# Patient Record
Sex: Male | Born: 1985 | Race: Black or African American | Hispanic: No | Marital: Single | Smoking: Current every day smoker
Health system: Southern US, Community
[De-identification: ages and names within clinical notes are randomized; demographics above are authoritative.]

## PROBLEM LIST (undated history)

## (undated) DIAGNOSIS — E669 Obesity, unspecified: Secondary | ICD-10-CM

---
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
## 2010-07-18 ENCOUNTER — Emergency Department (HOSPITAL_COMMUNITY): Payer: Commercial Managed Care - PPO

## 2010-07-18 ENCOUNTER — Emergency Department (HOSPITAL_COMMUNITY)
Admission: EM | Admit: 2010-07-18 | Discharge: 2010-07-18 | Disposition: A | Discharge status: Home / Self Care | Payer: Commercial Managed Care - PPO | Attending: Emergency Medicine | Admitting: Emergency Medicine

## 2010-07-18 DIAGNOSIS — X58XXXA Exposure to other specified factors, initial encounter: Secondary | ICD-10-CM | POA: Insufficient documentation

## 2010-07-18 DIAGNOSIS — R946 Abnormal results of thyroid function studies: Secondary | ICD-10-CM | POA: Insufficient documentation

## 2010-07-18 DIAGNOSIS — IMO0002 Reserved for concepts with insufficient information to code with codable children: Secondary | ICD-10-CM | POA: Insufficient documentation

## 2010-07-18 DIAGNOSIS — M25476 Effusion, unspecified foot: Secondary | ICD-10-CM | POA: Insufficient documentation

## 2010-07-18 DIAGNOSIS — M25473 Effusion, unspecified ankle: Secondary | ICD-10-CM | POA: Insufficient documentation

## 2011-12-03 ENCOUNTER — Emergency Department (HOSPITAL_COMMUNITY)
Admission: EM | Admit: 2011-12-03 | Discharge: 2011-12-03 | Disposition: A | Discharge status: Home / Self Care | Payer: Self-pay | Attending: Emergency Medicine | Admitting: Emergency Medicine

## 2011-12-03 ENCOUNTER — Emergency Department (HOSPITAL_COMMUNITY): Payer: Self-pay

## 2011-12-03 ENCOUNTER — Encounter (HOSPITAL_COMMUNITY): Payer: Self-pay | Admitting: Emergency Medicine

## 2011-12-03 DIAGNOSIS — Y9361 Activity, american tackle football: Secondary | ICD-10-CM | POA: Insufficient documentation

## 2011-12-03 DIAGNOSIS — X500XXA Overexertion from strenuous movement or load, initial encounter: Secondary | ICD-10-CM | POA: Insufficient documentation

## 2011-12-03 DIAGNOSIS — S93409A Sprain of unspecified ligament of unspecified ankle, initial encounter: Secondary | ICD-10-CM | POA: Insufficient documentation

## 2011-12-03 DIAGNOSIS — F17200 Nicotine dependence, unspecified, uncomplicated: Secondary | ICD-10-CM | POA: Insufficient documentation

## 2011-12-03 DIAGNOSIS — S93402A Sprain of unspecified ligament of left ankle, initial encounter: Secondary | ICD-10-CM

## 2011-12-03 DIAGNOSIS — Y9239 Other specified sports and athletic area as the place of occurrence of the external cause: Secondary | ICD-10-CM | POA: Insufficient documentation

## 2011-12-03 MED ORDER — NAPROXEN 250 MG PO TABS: 250.0000 mg | ORAL_TABLET | Freq: Two times a day (BID) | ORAL | Status: DC

## 2011-12-03 MED ORDER — HYDROCODONE-ACETAMINOPHEN 5-325 MG PO TABS
ORAL_TABLET | ORAL | Status: DC
Start: 2011-12-03 — End: 2012-05-22

## 2011-12-03 NOTE — ED Notes (Signed)
Pt c/o left ankle pain after twisting ankle playing football on Saturday with pain and swelling

## 2011-12-03 NOTE — Progress Notes (Signed)
Orthopedic Tech Progress Note Patient Details:  Philip Bautista 1985-11-23 [id number]  Ortho Devices Type of Ortho Device: Ankle Air splint;Crutches Ortho Device/Splint Location: LEFT AIR CAST AND CRUTCHES 6.3'' Ortho Device/Splint Interventions: Application   Cammer, Mickie Bail 12/03/2011, 5:59 PM

## 2011-12-03 NOTE — ED Notes (Signed)
Ortho called 

## 2011-12-03 NOTE — ED Provider Notes (Signed)
History  This chart was scribed for Laray Anger, DO by Kautzman Scrape, ED Scribe. This patient was seen in room TR06C/TR06C and the patient's care was started at 5:20 PM.  CSN: [id number]  Arrival date & time 12/03/11  1537   First MD Initiated Contact with Patient 12/03/11 1720      Chief Complaint  Patient presents with  . Ankle Pain    The history is provided by the patient. No language interpreter was used.   Pt was seen at 5:22 PM   Philip Bautista is a 26 y.o. male who presents to the Emergency Department complaining of gradual onset and persistence of constant left ankle "pain" that began 4 days ago.  Pt states the pain began after he "twisted it" while playing football.  Pain worsens with ambulation.  He also reports that he has sprained the same ankle severeal times but denies any prior fractures or having an orthopedist to follow up with. He denies any other injuries. Denies knee/foot pain, no focal motor weakness, no tingling/numbness in extremities, no open wounds.     History reviewed. No pertinent past medical history.  History reviewed. No pertinent past surgical history.   History  Substance Use Topics  . Smoking status: Current Every Day Smoker  . Smokeless tobacco: Not on file  . Alcohol Use: Yes      Review of Systems ROS: Statement: All systems negative except as marked or noted in the HPI; Constitutional: Negative for fever and chills. ; ; Eyes: Negative for eye pain, redness and discharge. ; ; ENMT: Negative for ear pain, hoarseness, nasal congestion, sinus pressure and sore throat. ; ; Cardiovascular: Negative for chest pain, palpitations, diaphoresis, dyspnea and peripheral edema. ; ; Respiratory: Negative for cough, wheezing and stridor. ; ; Gastrointestinal: Negative for nausea, vomiting, diarrhea, abdominal pain, blood in stool, hematemesis, jaundice and rectal bleeding.; ; Genitourinary: Negative for dysuria, flank pain and hematuria. ; ;  Musculoskeletal: Negative for back pain and neck pain. +left ankle pain.; ; Skin: Negative for pruritus, rash, abrasions, blisters, bruising and skin lesion.; ; Neuro: Negative for headache, lightheadedness and neck stiffness. Negative for weakness, altered level of consciousness , altered mental status, extremity weakness, paresthesias, involuntary movement, seizure and syncope.       Allergies  Review of patient's allergies indicates no known allergies.  Home Medications  No current outpatient prescriptions on file.  Triage Vitals: BP 147/81  Pulse 64  Temp 98.2 F (36.8 C) (Oral)  Resp 18  SpO2 100%  Physical Exam 1730: Physical examination:  Nursing notes reviewed; Vital signs and O2 SAT reviewed;  Constitutional: Well developed, Well nourished, Well hydrated, In no acute distress; Head:  Normocephalic, atraumatic; Eyes: EOMI, PERRL, No scleral icterus; ENMT: Mouth and pharynx normal, Mucous membranes moist; Neck: Supple, Full range of motion, No lymphadenopathy; Cardiovascular: Regular rate and rhythm, No murmur, rub, or gallop; Respiratory: Breath sounds clear & equal bilaterally, No rales, rhonchi, wheezes.  Speaking full sentences with ease, Normal respiratory effort/excursion; Chest: Nontender, Movement normal;; Extremities: Pulses normal, +tender to palp left lateral maleolar area w/localized edema, NMS intact left foot, strong pedal pp, LE muscle compartments soft.  No left proximal fibular head tenderness, no knee tenderness, no foot tenderness.  No deformity, no ecchymosis, no erythema, no open wounds.  +plantarflexion of left foot w/calf squeeze.  No palpable gap left Achilles's tendon.  Decreased ROM F/E d/t pain.  No calf edema or asymmetry.; Neuro: AA&Ox3, Major CN grossly intact.  Speech clear. No gross focal motor or sensory deficits in extremities.; Skin: Color normal, Warm, Dry.   ED Course  SPLINT APPLICATION Performed by: Laray Anger Authorized by: Laray Anger Consent: Verbal consent obtained. Risks and benefits: risks, benefits and alternatives were discussed Consent given by: patient Patient understanding: patient states understanding of the procedure being performed Imaging studies: imaging studies available Patient identity confirmed: verbally with patient and arm band Location details: left ankle Splint type: airsplint. Post-procedure: The splinted body part was neurovascularly unchanged following the procedure. Patient tolerance: Patient tolerated the procedure well with no immediate complications.     DIAGNOSTIC STUDIES: Oxygen Saturation is 100% on room air, normal by my interpretation.    COORDINATION OF CARE: 5:23 PM Discussed treatment plan which includes x-rays of the left ankle with pt at bedside and pt agreed to plan.      MDM  MDM Reviewed: nursing note and vitals Interpretation: x-ray   Dg Ankle Complete Left 12/03/2011  *RADIOLOGY REPORT*  Clinical Data: Twisted left ankle 4 days ago  LEFT ANKLE COMPLETE - 3+ VIEW  Comparison: None.  Findings: Three views of the left ankle submitted.  No acute fracture or subluxation.  Small plantar spur of the calcaneus. Ankle mortise is preserved.  IMPRESSION: No acute fracture or subluxation.  Small plantar spur of the calcaneus.   Original Report Authenticated By: Natasha Mead, M.D.      Vito.Ghee:  Will place airsplint and give crutches.  Dx and testing d/w pt.  Questions answered.  Verb understanding, agreeable to d/c home with outpt f/u.     I personally performed the services described in this documentation, which was scribed in my presence. The recorded information has been reviewed and is accurate.    Laray Anger, DO 12/05/11 1421

## 2012-05-22 ENCOUNTER — Encounter (HOSPITAL_COMMUNITY): Payer: Self-pay | Admitting: *Deleted

## 2012-05-22 ENCOUNTER — Emergency Department (HOSPITAL_COMMUNITY)
Admission: EM | Admit: 2012-05-22 | Discharge: 2012-05-22 | Disposition: A | Discharge status: Home / Self Care | Payer: Self-pay | Attending: Emergency Medicine | Admitting: Emergency Medicine

## 2012-05-22 DIAGNOSIS — J309 Allergic rhinitis, unspecified: Secondary | ICD-10-CM | POA: Insufficient documentation

## 2012-05-22 DIAGNOSIS — R059 Cough, unspecified: Secondary | ICD-10-CM | POA: Insufficient documentation

## 2012-05-22 DIAGNOSIS — F17200 Nicotine dependence, unspecified, uncomplicated: Secondary | ICD-10-CM | POA: Insufficient documentation

## 2012-05-22 DIAGNOSIS — R05 Cough: Secondary | ICD-10-CM | POA: Insufficient documentation

## 2012-05-22 MED ORDER — FLUTICASONE PROPIONATE 50 MCG/ACT NA SUSP: 2.0000 | Freq: Every day | NASAL | Status: DC

## 2012-05-22 NOTE — ED Notes (Signed)
Pt states that he has had nasal drainage and cough for 1 month. Pt states that OTC medications have not helped. Pt reports hx of allergies

## 2012-05-22 NOTE — ED Provider Notes (Signed)
Medical screening examination/treatment/procedure(s) were performed by non-physician practitioner and as supervising physician I was immediately available for consultation/collaboration.  Flint Melter, MD 05/22/12 1900

## 2012-05-22 NOTE — ED Provider Notes (Signed)
History     CSN: [id number]  Arrival date & time 05/22/12  1024   First MD Initiated Contact with Patient 05/22/12 1042      Chief Complaint  Patient presents with  . Allergies    (Consider location/radiation/quality/duration/timing/severity/associated sxs/prior treatment) HPI  Philip Bautista is a 27 y.o. male complaining of clear nasal drainage and positional cough worsening over the course of last month. Patient has been taking Zyrtec, Benadryl and Afrin with little relief. He denies fever, sputum production, chest pain, nausea vomiting, change in bowel or bladder habits.  History reviewed. No pertinent past medical history.  History reviewed. No pertinent past surgical history.  No family history on file.  History  Substance Use Topics  . Smoking status: Current Every Day Smoker  . Smokeless tobacco: Not on file  . Alcohol Use: Yes      Review of Systems  Constitutional: Negative for fever.  HENT: Positive for rhinorrhea.   Respiratory: Positive for cough. Negative for shortness of breath.   Cardiovascular: Negative for chest pain.  Gastrointestinal: Negative for nausea, vomiting, abdominal pain and diarrhea.  All other systems reviewed and are negative.    Allergies  Review of patient's allergies indicates no known allergies.  Home Medications   Current Outpatient Rx  Name  Route  Sig  Dispense  Refill  . cetirizine (ZYRTEC) 10 MG tablet   Oral   Take 10 mg by mouth daily as needed for allergies.         . diphenhydrAMINE (BENADRYL) 25 MG tablet   Oral   Take 25 mg by mouth at bedtime as needed for allergies.         Marland Kitchen oxymetazoline (AFRIN) 0.05 % nasal spray   Nasal   Place 2 sprays into the nose 3 (three) times daily.           BP 133/82  Pulse 64  Temp(Src) 97.3 F (36.3 C) (Oral)  Resp 16  Ht 6\' 3"  (1.905 m)  Wt 290 lb (131.543 kg)  BMI 36.25 kg/m2  SpO2 96%  Physical Exam  Nursing note and vitals reviewed. Constitutional:  He is oriented to person, place, and time. He appears well-developed and well-nourished. No distress.  HENT:  Head: Normocephalic.  Right Ear: External ear normal.  Left Ear: External ear normal.  Mouth/Throat: Oropharynx is clear and moist. No oropharyngeal exudate.  Eyes: Conjunctivae and EOM are normal.  Neck: Normal range of motion.  Cardiovascular: Normal rate, regular rhythm and intact distal pulses.   Pulmonary/Chest: Effort normal and breath sounds normal. No stridor. No respiratory distress. He has no wheezes. He has no rales. He exhibits no tenderness.  Abdominal: Soft. Bowel sounds are normal. He exhibits no distension and no mass. There is no tenderness. There is no rebound and no guarding.  Musculoskeletal: Normal range of motion.  Lymphadenopathy:    He has no cervical adenopathy.  Neurological: He is alert and oriented to person, place, and time.  Skin: Skin is warm.  Psychiatric: He has a normal mood and affect.    ED Course  Procedures (including critical care time)  Labs Reviewed - No data to display No results found.   1. Allergic rhinitis       MDM   Philip Bautista is a 27 y.o. male with allergic rhinitis, no signs of infection.   Filed Vitals:   05/22/12 1027 05/22/12 1035  BP: 133/82   Pulse: 64   Temp: 97.3 F (36.3 C)  TempSrc: Oral   Resp: 16   Height:  6\' 3"  (1.905 m)  Weight:  290 lb (131.543 kg)  SpO2: 96%      Pt verbalized understanding and agrees with care plan. Outpatient follow-up and return precautions given.    Discharge Medication List as of 05/22/2012 10:49 AM    START taking these medications   Details  fluticasone (FLONASE) 50 MCG/ACT nasal spray Place 2 sprays into the nose daily., Starting 05/22/2012, Until Discontinued, [employer], PA-C 05/22/12 1220

## 2012-10-07 ENCOUNTER — Emergency Department (HOSPITAL_COMMUNITY)
Admission: EM | Admit: 2012-10-07 | Discharge: 2012-10-07 | Disposition: A | Discharge status: Home / Self Care | Payer: Commercial Managed Care - PPO | Attending: Emergency Medicine | Admitting: Emergency Medicine

## 2012-10-07 ENCOUNTER — Encounter (HOSPITAL_COMMUNITY): Payer: Self-pay | Admitting: Emergency Medicine

## 2012-10-07 DIAGNOSIS — X500XXA Overexertion from strenuous movement or load, initial encounter: Secondary | ICD-10-CM | POA: Insufficient documentation

## 2012-10-07 DIAGNOSIS — Y9229 Other specified public building as the place of occurrence of the external cause: Secondary | ICD-10-CM | POA: Insufficient documentation

## 2012-10-07 DIAGNOSIS — F17200 Nicotine dependence, unspecified, uncomplicated: Secondary | ICD-10-CM | POA: Insufficient documentation

## 2012-10-07 DIAGNOSIS — Y93B3 Activity, free weights: Secondary | ICD-10-CM | POA: Insufficient documentation

## 2012-10-07 DIAGNOSIS — T148XXA Other injury of unspecified body region, initial encounter: Secondary | ICD-10-CM

## 2012-10-07 DIAGNOSIS — IMO0002 Reserved for concepts with insufficient information to code with codable children: Secondary | ICD-10-CM | POA: Insufficient documentation

## 2012-10-07 MED ORDER — IBUPROFEN 800 MG PO TABS: 800.0000 mg | ORAL_TABLET | Freq: Three times a day (TID) | ORAL | Status: DC

## 2012-10-07 NOTE — ED Notes (Signed)
Rt back of thigh pain started yesterday after he got off work tried to work out but this am it was worse hurts to walk does not remember any injury

## 2012-10-07 NOTE — ED Provider Notes (Signed)
CSN: [id number]     Arrival date & time 10/07/12  5621 History   First MD Initiated Contact with Patient 10/07/12 [phone]     Chief Complaint  Patient presents with  . Leg Pain   (Consider location/radiation/quality/duration/timing/severity/associated sxs/prior Treatment) HPI Comments: Patient presents to the emergency department with chief complaint of right leg pain. Patient states that yesterday after work, he went to the gym and was doing squats, when he believes that he strained his right hamstring. Patient states the pain is worsened with ambulation, but states that he is still able to appropriately. He has not tried anything to alleviate his symptoms. States the pain is moderate. He did not hear or feel a pop. The pain does not radiate.  The history is provided by the patient. No language interpreter was used.    History reviewed. No pertinent past medical history. History reviewed. No pertinent past surgical history. No family history on file. History  Substance Use Topics  . Smoking status: Current Every Day Smoker  . Smokeless tobacco: Not on file  . Alcohol Use: Yes    Review of Systems  All other systems reviewed and are negative.    Allergies  Review of patient's allergies indicates no known allergies.  Home Medications   Current Outpatient Rx  Name  Route  Sig  Dispense  Refill  . ibuprofen (ADVIL,MOTRIN) 800 MG tablet   Oral   Take 1 tablet (800 mg total) by mouth 3 (three) times daily.   21 tablet   0    Pulse 76  Temp(Src) 99 F (37.2 C)  Resp 16  SpO2 99% Physical Exam  Nursing note and vitals reviewed. Constitutional: He is oriented to person, place, and time. He appears well-developed and well-nourished.  HENT:  Head: Normocephalic and atraumatic.  Eyes: Conjunctivae and EOM are normal. Right eye exhibits no discharge. Left eye exhibits no discharge. No scleral icterus.  Neck: Normal range of motion. Neck supple. No JVD present.  Cardiovascular:  Normal rate, regular rhythm, normal heart sounds and intact distal pulses.  Exam reveals no gallop and no friction rub.   No murmur heard. Intact distal pulses with brisk capillary refill  Pulmonary/Chest: Effort normal and breath sounds normal. No respiratory distress. He has no wheezes. He has no rales. He exhibits no tenderness.  Abdominal: Soft. Bowel sounds are normal. He exhibits no distension and no mass. There is no tenderness. There is no rebound and no guarding.  Musculoskeletal: Normal range of motion. He exhibits tenderness. He exhibits no edema.  Right hamstring moderately tender to palpation, but no obvious masses or bulges, no evidence of bruising, range of motion is 5/5, strength 4/5 limited by pain, patient is able to ambulate appropriately  Neurological: He is alert and oriented to person, place, and time. He has normal reflexes.  Sensation and strength intact  Skin: Skin is warm and dry.  Psychiatric: He has a normal mood and affect. His behavior is normal. Judgment and thought content normal.    ED Course  Procedures (including critical care time) Labs Review Labs Reviewed - No data to display Imaging Review No results found.  MDM   1. Muscle strain    Patient with right hamstring muscle strain, no evidence of partial or complete tear, no bruising, no bulges, range of motion and strength are intact, but somewhat limited secondary to pain. Will have the patient followup with orthopedics/sports medicine. Recommend ice and NSAIDs. Patient understands and agrees with the plan. Return  precautions and restrictions were discussed. He is well appearing, not in any apparent distress. Discharged patient to home in good condition.    Roxy Horseman, PA-C 10/07/12 [phone]

## 2012-10-07 NOTE — ED Provider Notes (Signed)
Medical screening examination/treatment/procedure(s) were performed by non-physician practitioner and as supervising physician I was immediately available for consultation/collaboration.    Vida Roller, MD 10/07/12 [phone]

## 2014-04-15 ENCOUNTER — Emergency Department (HOSPITAL_COMMUNITY)
Admission: EM | Admit: 2014-04-15 | Discharge: 2014-04-15 | Disposition: A | Discharge status: Home / Self Care | Payer: Commercial Managed Care - PPO | Attending: Emergency Medicine | Admitting: Emergency Medicine

## 2014-04-15 ENCOUNTER — Encounter (HOSPITAL_COMMUNITY): Payer: Self-pay | Admitting: Physical Medicine and Rehabilitation

## 2014-04-15 DIAGNOSIS — Z720 Tobacco use: Secondary | ICD-10-CM | POA: Insufficient documentation

## 2014-04-15 DIAGNOSIS — Z791 Long term (current) use of non-steroidal anti-inflammatories (NSAID): Secondary | ICD-10-CM | POA: Insufficient documentation

## 2014-04-15 DIAGNOSIS — K2970 Gastritis, unspecified, without bleeding: Secondary | ICD-10-CM

## 2014-04-15 LAB — CBC WITH DIFFERENTIAL/PLATELET
BASOS ABS: 0 10*3/uL (ref 0.0–0.1)
BASOS PCT: 0 % (ref 0–1)
Eosinophils Absolute: 0.1 10*3/uL (ref 0.0–0.7)
Eosinophils Relative: 2 % (ref 0–5)
HEMATOCRIT: 46.4 % (ref 39.0–52.0)
HEMOGLOBIN: 15.7 g/dL (ref 13.0–17.0)
LYMPHS PCT: 29 % (ref 12–46)
Lymphs Abs: 2.6 10*3/uL (ref 0.7–4.0)
MCH: 27.9 pg (ref 26.0–34.0)
MCHC: 33.8 g/dL (ref 30.0–36.0)
MCV: 82.6 fL (ref 78.0–100.0)
Monocytes Absolute: 0.7 10*3/uL (ref 0.1–1.0)
Monocytes Relative: 7 % (ref 3–12)
NEUTROS ABS: 5.5 10*3/uL (ref 1.7–7.7)
Neutrophils Relative %: 62 % (ref 43–77)
Platelets: 300 10*3/uL (ref 150–400)
RBC: 5.62 MIL/uL (ref 4.22–5.81)
RDW: 14.4 % (ref 11.5–15.5)
WBC: 8.9 10*3/uL (ref 4.0–10.5)

## 2014-04-15 LAB — COMPREHENSIVE METABOLIC PANEL
ALK PHOS: 40 U/L (ref 39–117)
ALT: 20 U/L (ref 0–53)
ANION GAP: 10 (ref 5–15)
AST: 23 U/L (ref 0–37)
Albumin: 3.8 g/dL (ref 3.5–5.2)
BUN: 9 mg/dL (ref 6–23)
CALCIUM: 9.5 mg/dL (ref 8.4–10.5)
CO2: 27 mmol/L (ref 19–32)
Chloride: 103 mmol/L (ref 96–112)
Creatinine, Ser: 1.18 mg/dL (ref 0.50–1.35)
GFR calc non Af Amer: 83 mL/min — ABNORMAL LOW (ref >90)
GLUCOSE: 99 mg/dL (ref 70–99)
Potassium: 3.5 mmol/L (ref 3.5–5.1)
Sodium: 140 mmol/L (ref 135–145)
TOTAL PROTEIN: 5.8 g/dL — AB (ref 6.0–8.3)
Total Bilirubin: 0.7 mg/dL (ref 0.3–1.2)

## 2014-04-15 MED ORDER — ONDANSETRON HCL 4 MG PO TABS: 4.0000 mg | ORAL_TABLET | Freq: Four times a day (QID) | ORAL | Status: DC

## 2014-04-15 NOTE — Discharge Instructions (Signed)
Please rest, drink plenty fluids, and monitor for new or worsening signs or symptoms. Please return if any concerning signs or symptoms present. Use Zofran as needed for nausea

## 2014-04-15 NOTE — ED Provider Notes (Signed)
CSN: [id number]     Arrival date & time 04/15/14  1315 History   First MD Initiated Contact with Patient 04/15/14 1503     Chief Complaint  Patient presents with  . Nausea  . Emesis   HPI   29 year old male presents today with 24 hours of nausea and vomiting. Reports the episodic with 2 episodes last night while he was at work, and to this morning upon wakening. Reports the emesis is nonbloody, and feels "normal in between episodes". He denies headache, rhinorrhea, sore throat, cough, chest pain, shortness breath, abdominal pain, changes in urination, lower extremity swelling. He notes he hasn't had a bowel movement since late on Wednesday, reporting that he has had a reduced appetite since then. He reports the nausea is not associated with fluid intake, but has not eaten any food. Patient denies any abdominal surgeries, chronic health conditions. He takes no medications at this time. She reports he lives at home with his daughter, has not any abnormal foods, nobody in his household is sick. At time of evaluation patient has no concerns, no abdominal pain, no nausea.  History reviewed. No pertinent past medical history. History reviewed. No pertinent past surgical history. History reviewed. No pertinent family history. History  Substance Use Topics  . Smoking status: Current Every Day Smoker    Types: Cigarettes  . Smokeless tobacco: Not on file  . Alcohol Use: Yes    Review of Systems  All other systems reviewed and are negative.   Allergies  Review of patient's allergies indicates no known allergies.  Home Medications   Prior to Admission medications   Medication Sig Start Date End Date Taking? Authorizing Provider  ibuprofen (ADVIL,MOTRIN) 800 MG tablet Take 1 tablet (800 mg total) by mouth 3 (three) times daily. 10/07/12   Roxy Horsemanobert Browning, PA-C   BP 118/74 mmHg  Pulse 60  Temp(Src) 98.7 F (37.1 C) (Oral)  Resp 18  Ht 6\' 3"  (1.905 m)  Wt 280 lb (127.007 kg)  BMI 35.00  kg/m2  SpO2 100% Physical Exam  Constitutional: He is oriented to person, place, and time. He appears well-developed and well-nourished.  HENT:  Head: Normocephalic and atraumatic.  Eyes: Pupils are equal, round, and reactive to light.  Neck: Normal range of motion. Neck supple. No JVD present. No tracheal deviation present. No thyromegaly present.  Cardiovascular: Normal rate, regular rhythm, normal heart sounds and intact distal pulses.  Exam reveals no gallop and no friction rub.   No murmur heard. Pulmonary/Chest: Effort normal and breath sounds normal. No stridor. No respiratory distress. He has no wheezes. He has no rales. He exhibits no tenderness.  Abdominal: Soft. Bowel sounds are normal. He exhibits no distension and no mass. There is no tenderness. There is no rebound and no guarding.  Musculoskeletal: Normal range of motion.  Lymphadenopathy:    He has no cervical adenopathy.  Neurological: He is alert and oriented to person, place, and time. Coordination normal.  Skin: Skin is warm and dry.  Psychiatric: He has a normal mood and affect. His behavior is normal. Judgment and thought content normal.  Nursing note and vitals reviewed.   ED Course  Procedures (including critical care time) Labs Review Labs Reviewed  COMPREHENSIVE METABOLIC PANEL - Abnormal; Notable for the following:    Total Protein 5.8 (*)    GFR calc non Af Amer 83 (*)    All other components within normal limits  CBC WITH DIFFERENTIAL/PLATELET    Imaging Review No results  found.   EKG Interpretation None     MDM   Final diagnoses:  Gastritis    At time of evaluation patient have any complaints. He reports the last bout of emesis with this morning and is not associated with abdominal pain, chest pain, diaphoresis, shortness of breath or any other signs or symptoms. Patient's vitals were stable and indicated that he was not planning to complete it. He was given fluid challenge test with no  difficulty. He was prescribed Zofran as needed for nausea instructed follow-up with his primary care provider or the emergency room if new or worsening symptoms presented. Patient understood and agreed to the plan.      Eyvonne Mechanic, PA-C 04/15/14 1806  Glynn Octave, MD 04/15/14 [phone]

## 2014-04-15 NOTE — ED Notes (Signed)
Pt presents to department for evaluation of nausea and vomiting. Onset yesterday. Denies abdominal pain. No signs of distress noted.

## 2015-11-06 ENCOUNTER — Ambulatory Visit (HOSPITAL_COMMUNITY)
Admission: EM | Admit: 2015-11-06 | Discharge: 2015-11-06 | Discharge status: Against Medical Advice | Payer: Commercial Managed Care - PPO

## 2015-11-06 NOTE — ED Notes (Signed)
No answer in lobby.

## 2015-11-07 ENCOUNTER — Emergency Department (HOSPITAL_COMMUNITY)
Admission: EM | Admit: 2015-11-07 | Discharge: 2015-11-07 | Disposition: A | Discharge status: Home / Self Care | Payer: Self-pay | Attending: Emergency Medicine | Admitting: Emergency Medicine

## 2015-11-07 ENCOUNTER — Encounter (HOSPITAL_COMMUNITY): Payer: Self-pay | Admitting: Emergency Medicine

## 2015-11-07 ENCOUNTER — Emergency Department (HOSPITAL_COMMUNITY): Payer: Self-pay

## 2015-11-07 DIAGNOSIS — K2100 Gastro-esophageal reflux disease with esophagitis, without bleeding: Secondary | ICD-10-CM

## 2015-11-07 DIAGNOSIS — R0789 Other chest pain: Secondary | ICD-10-CM | POA: Insufficient documentation

## 2015-11-07 DIAGNOSIS — F17210 Nicotine dependence, cigarettes, uncomplicated: Secondary | ICD-10-CM | POA: Insufficient documentation

## 2015-11-07 DIAGNOSIS — K219 Gastro-esophageal reflux disease without esophagitis: Secondary | ICD-10-CM | POA: Insufficient documentation

## 2015-11-07 LAB — CBC
HEMATOCRIT: 44.4 % (ref 39.0–52.0)
HEMOGLOBIN: 14.8 g/dL (ref 13.0–17.0)
MCH: 27.2 pg (ref 26.0–34.0)
MCHC: 33.3 g/dL (ref 30.0–36.0)
MCV: 81.6 fL (ref 78.0–100.0)
Platelets: 297 10*3/uL (ref 150–400)
RBC: 5.44 MIL/uL (ref 4.22–5.81)
RDW: 14.5 % (ref 11.5–15.5)
WBC: 11.7 10*3/uL — ABNORMAL HIGH (ref 4.0–10.5)

## 2015-11-07 LAB — I-STAT TROPONIN, ED
Troponin i, poc: 0 ng/mL (ref 0.00–0.08)
Troponin i, poc: 0.01 ng/mL (ref 0.00–0.08)

## 2015-11-07 LAB — BASIC METABOLIC PANEL
ANION GAP: 10 (ref 5–15)
BUN: 8 mg/dL (ref 6–20)
CALCIUM: 9.1 mg/dL (ref 8.9–10.3)
CHLORIDE: 103 mmol/L (ref 101–111)
CO2: 24 mmol/L (ref 22–32)
Creatinine, Ser: 1.11 mg/dL (ref 0.61–1.24)
GFR calc non Af Amer: >60 mL/min (ref >60)
GLUCOSE: 105 mg/dL — AB (ref 65–99)
Potassium: 3.9 mmol/L (ref 3.5–5.1)
Sodium: 137 mmol/L (ref 135–145)

## 2015-11-07 MED ORDER — ACETAMINOPHEN 325 MG PO TABS
650.0000 mg | ORAL_TABLET | Freq: Once | ORAL | Status: AC
  Administered 2015-11-07: 650 mg via ORAL
  Filled 2015-11-07: qty 2

## 2015-11-07 MED ORDER — GI COCKTAIL ~~LOC~~
30.0000 mL | Freq: Once | ORAL | Status: AC
  Administered 2015-11-07: 30 mL via ORAL
  Filled 2015-11-07: qty 30

## 2015-11-07 MED ORDER — PANTOPRAZOLE SODIUM 40 MG PO TBEC: 40.0000 mg | DELAYED_RELEASE_TABLET | Freq: Every day | ORAL | 0 refills | Status: DC

## 2015-11-07 MED ORDER — FAMOTIDINE 20 MG PO TABS
20.0000 mg | ORAL_TABLET | Freq: Once | ORAL | Status: AC
  Administered 2015-11-07: 20 mg via ORAL
  Filled 2015-11-07: qty 1

## 2015-11-07 NOTE — ED Notes (Signed)
States cp started on friiday, went to [facility] yesterday but they had long wait and he left, states vomited yesterday at work and dizziness started yesterday

## 2015-11-07 NOTE — Discharge Instructions (Signed)
It was our pleasure to provide your ER care today - we hope that you feel better.  Take protonix (acid blocker medication).  You may also try maalox or mylanta as need for symptom relief.  Take tylenol as need.  Follow up with primary care doctor in the next few days.   Follow chest discomfort, and ecg, follow up with cardiologist in the next week - see referral - call office to arrange appointment.  Return to ER if worse, recurrent or persistent chest pain, trouble breathing, other concern.

## 2015-11-07 NOTE — ED Notes (Signed)
Pt states pain is a 4 now, was sleeping before

## 2015-11-07 NOTE — ED Provider Notes (Signed)
[facility]-EMERGENCY DEPT Provider Note   CSN: [id number] Arrival date & time: 11/07/15  0702     History   Chief Complaint Chief Complaint  Patient presents with  . Dizziness  . Chest Pain    HPI Zachery DakinsCharles A Loeffler is a 30 y.o. male.  Patient c/o 'heartburn' for the past 3 days. He indicates symptoms have been constant, persistent, worse w eating, but cant identify specific food type that makes it worse. No radiation of pain. No associated nv, diaphoresis or sob. Denies any exertional cp or discomfort. No unusual doe. No cough or uri c/o. No fever or chills. No chest wall injury or strain. No hx gerd.  Denies fam hx cad. No cocaine use. +smoker.     The history is provided by the patient.  Dizziness  Associated symptoms: chest pain   Associated symptoms: no headaches, no palpitations, no shortness of breath and no vomiting   Chest Pain   Associated symptoms include dizziness. Pertinent negatives include no abdominal pain, no back pain, no fever, no headaches, no palpitations, no shortness of breath and no vomiting.    History reviewed. No pertinent past medical history.  There are no active problems to display for this patient.   History reviewed. No pertinent surgical history.     Home Medications    Prior to Admission medications   Medication Sig Start Date End Date Taking? Authorizing Provider  ibuprofen (ADVIL,MOTRIN) 800 MG tablet Take 1 tablet (800 mg total) by mouth 3 (three) times daily. Patient not taking: Reported on 04/15/2014 10/07/12   Roxy Horsemanobert Browning, PA-C  ondansetron (ZOFRAN) 4 MG tablet Take 1 tablet (4 mg total) by mouth every 6 (six) hours. 04/15/14   Eyvonne MechanicJeffrey Hedges, PA-C    Family History No family history on file.  Social History Social History  Substance Use Topics  . Smoking status: Current Every Day Smoker    Packs/day: 1.00    Types: Cigarettes  . Smokeless tobacco: Not on file  . Alcohol use No     Allergies   Lentil   Review of  Systems Review of Systems  Constitutional: Negative for fever.  HENT: Negative for sore throat.   Eyes: Negative for redness.  Respiratory: Negative for shortness of breath.   Cardiovascular: Positive for chest pain. Negative for palpitations and leg swelling.  Gastrointestinal: Negative for abdominal pain and vomiting.  Genitourinary: Negative for flank pain.  Musculoskeletal: Negative for back pain and neck pain.  Skin: Negative for rash.  Neurological: Positive for dizziness. Negative for headaches.  Hematological: Does not bruise/bleed easily.  Psychiatric/Behavioral: Negative for confusion.     Physical Exam Updated Vital Signs BP 127/89 (BP Location: Right Arm)   Pulse 69   Temp 98.4 F (36.9 C) (Oral)   Resp 16   Ht 6\' 3"  (1.905 m)   Wt 134.7 kg   SpO2 96%   BMI 37.12 kg/m   Physical Exam  Constitutional: He appears well-developed and well-nourished. No distress.  HENT:  Head: Atraumatic.  Mouth/Throat: Oropharynx is clear and moist.  Eyes: Conjunctivae are normal.  Neck: Neck supple. No tracheal deviation present.  Cardiovascular: Normal rate, regular rhythm, normal heart sounds and intact distal pulses.  Exam reveals no gallop and no friction rub.   No murmur heard. Pulmonary/Chest: Effort normal and breath sounds normal. No accessory muscle usage. No respiratory distress. He has no wheezes. He has no rales. He exhibits no tenderness.  Abdominal: Soft. Bowel sounds are normal. He exhibits no distension.  There is no tenderness.  Musculoskeletal: He exhibits no edema or tenderness.  Neurological: He is alert.  Skin: Skin is warm and dry. No rash noted. He is not diaphoretic.  Psychiatric: He has a normal mood and affect.  Nursing note and vitals reviewed.    ED Treatments / Results  Labs (all labs ordered are listed, but only abnormal results are displayed) Results for orders placed or performed during the hospital encounter of 11/07/15  Basic metabolic  panel  Result Value Ref Range   Sodium 137 135 - 145 mmol/L   Potassium 3.9 3.5 - 5.1 mmol/L   Chloride 103 101 - 111 mmol/L   CO2 24 22 - 32 mmol/L   Glucose, Bld 105 (H) 65 - 99 mg/dL   BUN 8 6 - 20 mg/dL   Creatinine, Ser 1.61 0.61 - 1.24 mg/dL   Calcium 9.1 8.9 - 09.6 mg/dL   GFR calc non Af Amer >60 >60 mL/min   GFR calc Af Amer >60 >60 mL/min   Anion gap 10 5 - 15  CBC  Result Value Ref Range   WBC 11.7 (H) 4.0 - 10.5 K/uL   RBC 5.44 4.22 - 5.81 MIL/uL   Hemoglobin 14.8 13.0 - 17.0 g/dL   HCT 04.5 40.9 - 81.1 %   MCV 81.6 78.0 - 100.0 fL   MCH 27.2 26.0 - 34.0 pg   MCHC 33.3 30.0 - 36.0 g/dL   RDW 91.4 78.2 - 95.6 %   Platelets 297 150 - 400 K/uL  I-stat troponin, ED  Result Value Ref Range   Troponin i, poc 0.00 0.00 - 0.08 ng/mL   Comment 3          I-stat troponin, ED  Result Value Ref Range   Troponin i, poc 0.01 0.00 - 0.08 ng/mL   Comment 3           Dg Chest 2 View  Result Date: 11/07/2015 CLINICAL DATA:  Chest pain for several days, history of asthma EXAM: CHEST  2 VIEW COMPARISON:  None. FINDINGS: No active infiltrate or effusion is seen. Opacity overlying the anterior left first costochondral junction appears degenerative in origin as well as overlapping vasculature. Mediastinal and hilar contours are unremarkable. The heart is within normal limits in size. No bony abnormality is seen. IMPRESSION: No active cardiopulmonary disease. Electronically Signed   By: Dwyane Dee M.D.   On: 11/07/2015 08:03    EKG  EKG Interpretation  Date/Time:  Tuesday November 07 2015 07:19:21 EDT Ventricular Rate:  65 PR Interval:    QRS Duration: 111 QT Interval:  375 QTC Calculation: 390 R Axis:   20 Text Interpretation:  Sinus rhythm Nonspecific T wave abnormality No previous tracing Confirmed by Denton Lank  MD, Caryn Bee ([id number]) on 11/07/2015 7:23:59 AM       Radiology Dg Chest 2 View  Result Date: 11/07/2015 CLINICAL DATA:  Chest pain for several days, history of  asthma EXAM: CHEST  2 VIEW COMPARISON:  None. FINDINGS: No active infiltrate or effusion is seen. Opacity overlying the anterior left first costochondral junction appears degenerative in origin as well as overlapping vasculature. Mediastinal and hilar contours are unremarkable. The heart is within normal limits in size. No bony abnormality is seen. IMPRESSION: No active cardiopulmonary disease. Electronically Signed   By: Dwyane Dee M.D.   On: 11/07/2015 08:03    Procedures Procedures (including critical care time)  Medications Ordered in ED Medications  gi cocktail (Maalox,Lidocaine,Donnatal) (not administered)  famotidine (  PEPCID) tablet 20 mg (not administered)  acetaminophen (TYLENOL) tablet 650 mg (not administered)     Initial Impression / Assessment and Plan / ED Course  I have reviewed the triage vital signs and the nursing notes.  Pertinent labs & imaging results that were available during my care of the patient were reviewed by me and considered in my medical decision making (see chart for details).  Clinical Course    Iv ns. Labs. Cxr.  Patient c/o heartburn.  Pepcid, gi cocktail, given for symptom relief.  Reviewed nursing notes and prior charts for additional history.   Patient with relief of symptoms with gi meds.  After having constant symptoms at rest for > 1 day, initial and repeat troponin negative.   No old ecg to compare - although symptoms appear most c/w gerd/esophagitis, given not normal ecg with no old to compare, will also refer to card f/u re atypical cp/abn ecg.   Patient current symptom free, initial and repeat trop normal, and patient currently appears stable for d/c.     Final Clinical Impressions(s) / ED Diagnoses   Final diagnoses:  None    New Prescriptions New Prescriptions   No medications on file     Cathren Laine, MD 11/07/15 1124

## 2015-11-07 NOTE — Discharge Planning (Signed)
[facility] for possible CM needs.  No needs communicated or identified.

## 2015-11-07 NOTE — ED Triage Notes (Signed)
Patient states dizziness that started x 3 days ago.  Patient states he started having heart burn with same.   Patient states nothing made it better.  Patient states he took baking soda, tums and nothing resolved his heartburn.   Patient states the dizziness has mostly resolved at this time.

## 2015-11-26 ENCOUNTER — Emergency Department (HOSPITAL_COMMUNITY)
Admission: EM | Admit: 2015-11-26 | Discharge: 2015-11-26 | Disposition: A | Discharge status: Home / Self Care | Payer: Self-pay | Attending: Emergency Medicine | Admitting: Emergency Medicine

## 2015-11-26 ENCOUNTER — Encounter (HOSPITAL_COMMUNITY): Payer: Self-pay | Admitting: *Deleted

## 2015-11-26 DIAGNOSIS — F17210 Nicotine dependence, cigarettes, uncomplicated: Secondary | ICD-10-CM | POA: Insufficient documentation

## 2015-11-26 DIAGNOSIS — R1111 Vomiting without nausea: Secondary | ICD-10-CM | POA: Insufficient documentation

## 2015-11-26 HISTORY — DX: Obesity, unspecified: E66.9

## 2015-11-26 LAB — URINALYSIS, ROUTINE W REFLEX MICROSCOPIC
BILIRUBIN URINE: NEGATIVE
GLUCOSE, UA: NEGATIVE mg/dL
HGB URINE DIPSTICK: NEGATIVE
KETONES UR: NEGATIVE mg/dL
Leukocytes, UA: NEGATIVE
NITRITE: NEGATIVE
PH: 6 (ref 5.0–8.0)
Protein, ur: NEGATIVE mg/dL
SPECIFIC GRAVITY, URINE: 1.018 (ref 1.005–1.030)

## 2015-11-26 LAB — COMPREHENSIVE METABOLIC PANEL
ALBUMIN: 3.5 g/dL (ref 3.5–5.0)
ALK PHOS: 40 U/L (ref 38–126)
ALT: 38 U/L (ref 17–63)
AST: 38 U/L (ref 15–41)
Anion gap: 7 (ref 5–15)
BILIRUBIN TOTAL: 0.7 mg/dL (ref 0.3–1.2)
BUN: 6 mg/dL (ref 6–20)
CALCIUM: 9.1 mg/dL (ref 8.9–10.3)
CO2: 27 mmol/L (ref 22–32)
Chloride: 106 mmol/L (ref 101–111)
Creatinine, Ser: 1.05 mg/dL (ref 0.61–1.24)
GFR calc Af Amer: >60 mL/min (ref >60)
GFR calc non Af Amer: >60 mL/min (ref >60)
GLUCOSE: 101 mg/dL — AB (ref 65–99)
POTASSIUM: 3.9 mmol/L (ref 3.5–5.1)
SODIUM: 140 mmol/L (ref 135–145)
TOTAL PROTEIN: 6.3 g/dL — AB (ref 6.5–8.1)

## 2015-11-26 LAB — CBC WITH DIFFERENTIAL/PLATELET
BASOS PCT: 1 %
Basophils Absolute: 0.1 10*3/uL (ref 0.0–0.1)
Eosinophils Absolute: 0.1 10*3/uL (ref 0.0–0.7)
Eosinophils Relative: 1 %
HEMATOCRIT: 46.1 % (ref 39.0–52.0)
HEMOGLOBIN: 15.5 g/dL (ref 13.0–17.0)
LYMPHS ABS: 2.1 10*3/uL (ref 0.7–4.0)
Lymphocytes Relative: 20 %
MCH: 27.7 pg (ref 26.0–34.0)
MCHC: 33.6 g/dL (ref 30.0–36.0)
MCV: 82.3 fL (ref 78.0–100.0)
MONO ABS: 0.9 10*3/uL (ref 0.1–1.0)
MONOS PCT: 9 %
NEUTROS ABS: 7.1 10*3/uL (ref 1.7–7.7)
Neutrophils Relative %: 69 %
Platelets: 313 10*3/uL (ref 150–400)
RBC: 5.6 MIL/uL (ref 4.22–5.81)
RDW: 14.8 % (ref 11.5–15.5)
WBC: 10.3 10*3/uL (ref 4.0–10.5)

## 2015-11-26 LAB — LIPASE, BLOOD: Lipase: 99 U/L — ABNORMAL HIGH (ref 11–51)

## 2015-11-26 MED ORDER — SODIUM CHLORIDE 0.9 % IV SOLN: INTRAVENOUS | Status: DC

## 2015-11-26 MED ORDER — SODIUM CHLORIDE 0.9 % IV BOLUS (SEPSIS)
1000.0000 mL | Freq: Once | INTRAVENOUS | Status: AC
  Administered 2015-11-26: 1000 mL via INTRAVENOUS

## 2015-11-26 MED ORDER — ONDANSETRON 8 MG PO TBDP: 8.0000 mg | ORAL_TABLET | Freq: Three times a day (TID) | ORAL | 0 refills | Status: DC | PRN

## 2015-11-26 MED ORDER — ONDANSETRON HCL 4 MG/2ML IJ SOLN
4.0000 mg | Freq: Once | INTRAMUSCULAR | Status: AC
  Administered 2015-11-26: 4 mg via INTRAVENOUS
  Filled 2015-11-26: qty 2

## 2015-11-26 NOTE — ED Notes (Signed)
Patient given ice chips. 

## 2015-11-26 NOTE — ED Triage Notes (Signed)
Pt reports vomiting in the middle of the night and feeling choked. Had 2nd episode of vomiting this am. Denies any abd pain or diarrhea. No acute distress noted at triage.

## 2015-11-26 NOTE — Discharge Instructions (Signed)
Take the medications as needed for nausea. You're pancreas enzyme was mildly elevated. Return to the emergency room if you start having any pain or worsening symptoms. Follow up with the primary doctor in the next few weeks to have that blood tests rechecked.

## 2015-11-26 NOTE — ED Provider Notes (Signed)
[facility]-EMERGENCY DEPT Provider Note   CSN: [id number] Arrival date & time: 11/26/15  1055     History   Chief Complaint Chief Complaint  Patient presents with  . Emesis    HPI Philip DakinsCharles A Bautista is a 30 y.o. male.  The history is provided by the patient.  Emesis   Episode onset: since last night. The problem occurs 2 to 4 times per day. The problem has not changed since onset.There has been no fever. Pertinent negatives include no abdominal pain, no chills, no diarrhea and no fever. Risk factors: no travel, no ill contacts, no concerns regarding food.    Past Medical History:  Diagnosis Date  . Obesity     There are no active problems to display for this patient.   History reviewed. No pertinent surgical history.     Home Medications    Prior to Admission medications   Medication Sig Start Date End Date Taking? Authorizing Provider  ondansetron (ZOFRAN ODT) 8 MG disintegrating tablet Take 1 tablet (8 mg total) by mouth every 8 (eight) hours as needed for nausea or vomiting. 11/26/15   Linwood DibblesJon Breyah Akhter, MD  pantoprazole (PROTONIX) 40 MG tablet Take 1 tablet (40 mg total) by mouth daily. Patient not taking: Reported on 11/26/2015 11/07/15   Cathren LaineKevin Steinl, MD    Family History History reviewed. No pertinent family history.  Social History Social History  Substance Use Topics  . Smoking status: Current Every Day Smoker    Packs/day: 1.00    Types: Cigarettes  . Smokeless tobacco: Not on file  . Alcohol use No     Allergies   Lentil   Review of Systems Review of Systems  Constitutional: Negative for chills and fever.  Respiratory: Negative for shortness of breath.   Cardiovascular: Negative for chest pain.  Gastrointestinal: Positive for vomiting. Negative for abdominal pain and diarrhea.  Genitourinary: Negative for dysuria.  Neurological: Negative for syncope.  All other systems reviewed and are negative.    Physical Exam Updated Vital Signs BP 144/85 (BP  Location: Left Arm)   Pulse (!) 58   Temp 98.3 F (36.8 C) (Oral)   Resp 14   SpO2 98%   Physical Exam  Constitutional: He appears well-developed and well-nourished. No distress.  HENT:  Head: Normocephalic and atraumatic.  Right Ear: External ear normal.  Left Ear: External ear normal.  Eyes: Conjunctivae are normal. Right eye exhibits no discharge. Left eye exhibits no discharge. No scleral icterus.  Neck: Neck supple. No tracheal deviation present.  Cardiovascular: Normal rate, regular rhythm and intact distal pulses.   Pulmonary/Chest: Effort normal and breath sounds normal. No stridor. No respiratory distress. He has no wheezes. He has no rales.  Abdominal: Soft. Bowel sounds are normal. He exhibits no distension and no mass. There is no tenderness. There is no rebound and no guarding. No hernia.  Musculoskeletal: He exhibits no edema or tenderness.  Neurological: He is alert. He has normal strength. No cranial nerve deficit (no facial droop, extraocular movements intact, no slurred speech) or sensory deficit. He exhibits normal muscle tone. He displays no seizure activity. Coordination normal.  Skin: Skin is warm and dry. No rash noted.  Psychiatric: He has a normal mood and affect.  Nursing note and vitals reviewed.    ED Treatments / Results  Labs (all labs ordered are listed, but only abnormal results are displayed) Labs Reviewed  COMPREHENSIVE METABOLIC PANEL - Abnormal; Notable for the following:  Result Value   Glucose, Bld 101 (*)    Total Protein 6.3 (*)    All other components within normal limits  LIPASE, BLOOD - Abnormal; Notable for the following:    Lipase 99 (*)    All other components within normal limits  CBC WITH DIFFERENTIAL/PLATELET  URINALYSIS, ROUTINE W REFLEX MICROSCOPIC ([facility])    EKG  EKG Interpretation None       Radiology No results found.  Procedures Procedures (including critical care time)  Medications Ordered in  ED Medications  sodium chloride 0.9 % bolus 1,000 mL (0 mLs Intravenous Stopped 11/26/15 1257)    And  0.9 %  sodium chloride infusion (not administered)  ondansetron (ZOFRAN) injection 4 mg (4 mg Intravenous Given 11/26/15 1200)     Initial Impression / Assessment and Plan / ED Course  I have reviewed the triage vital signs and the nursing notes.  Pertinent labs & imaging results that were available during my care of the patient were reviewed by me and considered in my medical decision making (see chart for details).  Clinical Course    Patient has an elevated lipase. I doubt that his symptoms are related to pancreatitis. He is not having any abdominal pain. Patient's symptoms improved with IV fluids and antinausea medications. I discussed follow-up with a primary care doctor to have his lipase rechecked. Return to the emergency room for worsening symptoms fever, pain.  Final Clinical Impressions(s) / ED Diagnoses   Final diagnoses:  Non-intractable vomiting without nausea, unspecified vomiting type    New Prescriptions New Prescriptions   ONDANSETRON (ZOFRAN ODT) 8 MG DISINTEGRATING TABLET    Take 1 tablet (8 mg total) by mouth every 8 (eight) hours as needed for nausea or vomiting.     Linwood DibblesJon Chandy Tarman, MD 11/26/15 [phone]

## 2016-05-12 ENCOUNTER — Encounter (HOSPITAL_COMMUNITY): Payer: Self-pay | Admitting: Emergency Medicine

## 2016-05-12 ENCOUNTER — Emergency Department (HOSPITAL_COMMUNITY): Payer: No Typology Code available for payment source

## 2016-05-12 ENCOUNTER — Emergency Department (HOSPITAL_COMMUNITY)
Admission: EM | Admit: 2016-05-12 | Discharge: 2016-05-12 | Disposition: A | Discharge status: Home / Self Care | Payer: No Typology Code available for payment source | Attending: Emergency Medicine | Admitting: Emergency Medicine

## 2016-05-12 DIAGNOSIS — Z79899 Other long term (current) drug therapy: Secondary | ICD-10-CM | POA: Insufficient documentation

## 2016-05-12 DIAGNOSIS — Y999 Unspecified external cause status: Secondary | ICD-10-CM | POA: Diagnosis not present

## 2016-05-12 DIAGNOSIS — Y939 Activity, unspecified: Secondary | ICD-10-CM | POA: Insufficient documentation

## 2016-05-12 DIAGNOSIS — M25562 Pain in left knee: Secondary | ICD-10-CM

## 2016-05-12 DIAGNOSIS — Y92481 Parking lot as the place of occurrence of the external cause: Secondary | ICD-10-CM | POA: Insufficient documentation

## 2016-05-12 DIAGNOSIS — F17210 Nicotine dependence, cigarettes, uncomplicated: Secondary | ICD-10-CM | POA: Insufficient documentation

## 2016-05-12 DIAGNOSIS — S8992XA Unspecified injury of left lower leg, initial encounter: Secondary | ICD-10-CM | POA: Diagnosis present

## 2016-05-12 MED ORDER — IBUPROFEN 800 MG PO TABS
800.0000 mg | ORAL_TABLET | Freq: Once | ORAL | Status: AC
  Administered 2016-05-12: 800 mg via ORAL
  Filled 2016-05-12: qty 1
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
## 2016-05-12 NOTE — Discharge Instructions (Signed)
Your imaging results today show questionable fracture.  I find it reasonable if you do not want to proceed with imaging at this time.  Please use knee immobilizer for comfort, follow-up with orthopedic surgeon for ongoing management of your knee pain.  Please use Tylenol or ibuprofen as needed for discomfort.

## 2016-06-18 ENCOUNTER — Encounter (HOSPITAL_COMMUNITY): Payer: Self-pay | Admitting: Emergency Medicine

## 2016-06-18 ENCOUNTER — Ambulatory Visit (HOSPITAL_COMMUNITY)
Admission: EM | Admit: 2016-06-18 | Discharge: 2016-06-18 | Disposition: A | Discharge status: Home / Self Care | Payer: PRIVATE HEALTH INSURANCE | Attending: Family Medicine | Admitting: Family Medicine

## 2016-06-18 DIAGNOSIS — R05 Cough: Secondary | ICD-10-CM

## 2016-06-18 DIAGNOSIS — R059 Cough, unspecified: Secondary | ICD-10-CM

## 2016-06-18 DIAGNOSIS — J209 Acute bronchitis, unspecified: Secondary | ICD-10-CM | POA: Diagnosis not present

## 2016-06-18 MED ORDER — METHYLPREDNISOLONE 4 MG PO TBPK: ORAL_TABLET | ORAL | 0 refills | Status: DC

## 2016-06-18 MED ORDER — AZITHROMYCIN 250 MG PO TABS: 250.0000 mg | ORAL_TABLET | Freq: Every day | ORAL | 0 refills | Status: DC

## 2016-06-18 MED ORDER — ALBUTEROL SULFATE HFA 108 (90 BASE) MCG/ACT IN AERS: 1.0000 | INHALATION_SPRAY | Freq: Four times a day (QID) | RESPIRATORY_TRACT | 0 refills | Status: DC | PRN

## 2016-06-18 NOTE — ED Provider Notes (Signed)
CSN: [id number]     Arrival date & time 06/18/16  1441 History   None    Chief Complaint  Patient presents with  . Cough   (Consider location/radiation/quality/duration/timing/severity/associated sxs/prior Treatment) Patient c/o severe cough and congestion for 5 days.   The history is provided by the patient.  URI  Presenting symptoms: congestion and cough   Severity:  Moderate Onset quality:  Sudden Duration:  5 days Timing:  Constant Progression:  Worsening Chronicity:  New Relieved by:  Nothing Worsened by:  Nothing Ineffective treatments:  None tried   Past Medical History:  Diagnosis Date  . Obesity    History reviewed. No pertinent surgical history. No family history on file. Social History  Substance Use Topics  . Smoking status: Current Every Day Smoker    Packs/day: 1.00    Types: Cigarettes  . Smokeless tobacco: Not on file  . Alcohol use Yes    Review of Systems  Constitutional: Negative.   HENT: Positive for congestion.   Eyes: Negative.   Respiratory: Positive for cough.   Cardiovascular: Negative.   Gastrointestinal: Negative.   Endocrine: Negative.   Genitourinary: Negative.   Musculoskeletal: Negative.   Allergic/Immunologic: Negative.   Neurological: Negative.   Hematological: Negative.   Psychiatric/Behavioral: Negative.     Allergies  Lentil  Home Medications   Prior to Admission medications   Medication Sig Start Date End Date Taking? Authorizing Provider  albuterol (PROVENTIL HFA;VENTOLIN HFA) 108 (90 Base) MCG/ACT inhaler Inhale 1-2 puffs into the lungs every 6 (six) hours as needed for wheezing or shortness of breath. 06/18/16   Deatra Canterxford, William J, FNP  azithromycin (ZITHROMAX) 250 MG tablet Take 1 tablet (250 mg total) by mouth daily. Take first 2 tablets together, then 1 every day until finished. 06/18/16   Deatra Canterxford, William J, FNP  methylPREDNISolone (MEDROL DOSEPAK) 4 MG TBPK tablet Take 6-5-4-3-2-1 po qd 06/18/16   Deatra Canterxford, William  J, FNP  ondansetron (ZOFRAN ODT) 8 MG disintegrating tablet Take 1 tablet (8 mg total) by mouth every 8 (eight) hours as needed for nausea or vomiting. 11/26/15   Linwood DibblesKnapp, Jon, MD  pantoprazole (PROTONIX) 40 MG tablet Take 1 tablet (40 mg total) by mouth daily. Patient not taking: Reported on 11/26/2015 11/07/15   Cathren LaineSteinl, Kevin, MD   Meds Ordered and Administered this Visit  Medications - No data to display  BP 127/87 (BP Location: Right Arm) Comment (BP Location): large cuff  Pulse 69   Temp 98.7 F (37.1 C) (Oral)   Resp 18   SpO2 98%  No data found.   Physical Exam  Constitutional: He is oriented to person, place, and time. He appears well-developed and well-nourished.  HENT:  Head: Normocephalic and atraumatic.  Right Ear: External ear normal.  Left Ear: External ear normal.  Mouth/Throat: Oropharynx is clear and moist.  Eyes: Conjunctivae and EOM are normal. Pupils are equal, round, and reactive to light.  Neck: Normal range of motion.  Cardiovascular: Normal rate, regular rhythm and normal heart sounds.   Pulmonary/Chest: Effort normal and breath sounds normal.  Abdominal: Soft. Bowel sounds are normal.  Musculoskeletal: Normal range of motion.  Neurological: He is alert and oriented to person, place, and time.  Nursing note and vitals reviewed.   Urgent Care Course     Procedures (including critical care time)  Labs Review Labs Reviewed - No data to display  Imaging Review No results found.   Visual Acuity Review  Right Eye Distance:   Left  Eye Distance:   Bilateral Distance:    Right Eye Near:   Left Eye Near:    Bilateral Near:         MDM   1. Acute bronchitis, unspecified organism   2. Cough    zpak Medrol dose pack Albuterol mdi Continue current cough and cold medicine otc.  Push po fluids, rest, tylenol and motrin otc prn as directed for fever, arthralgias, and myalgias.  Follow up prn if sx's continue or persist.    Deatra Canter,  FNP 06/18/16 1713

## 2016-06-18 NOTE — ED Triage Notes (Signed)
Onset of symptoms on 5/23.  Cough, sinus drainage, and cough is strong enough to where he vomits from coughing so hard

## 2016-08-23 ENCOUNTER — Emergency Department (HOSPITAL_COMMUNITY): Payer: PRIVATE HEALTH INSURANCE

## 2016-08-23 ENCOUNTER — Encounter (HOSPITAL_COMMUNITY): Payer: Self-pay | Admitting: Emergency Medicine

## 2016-08-23 ENCOUNTER — Emergency Department (HOSPITAL_COMMUNITY)
Admission: EM | Admit: 2016-08-23 | Discharge: 2016-08-23 | Disposition: A | Discharge status: Home / Self Care | Payer: PRIVATE HEALTH INSURANCE | Attending: Emergency Medicine | Admitting: Emergency Medicine

## 2016-08-23 DIAGNOSIS — Z720 Tobacco use: Secondary | ICD-10-CM

## 2016-08-23 DIAGNOSIS — F17210 Nicotine dependence, cigarettes, uncomplicated: Secondary | ICD-10-CM | POA: Insufficient documentation

## 2016-08-23 DIAGNOSIS — R059 Cough, unspecified: Secondary | ICD-10-CM

## 2016-08-23 DIAGNOSIS — R05 Cough: Secondary | ICD-10-CM | POA: Insufficient documentation

## 2016-08-23 MED ORDER — BENZONATATE 100 MG PO CAPS: 100.0000 mg | ORAL_CAPSULE | Freq: Three times a day (TID) | ORAL | 0 refills | Status: DC | PRN

## 2016-08-23 NOTE — ED Triage Notes (Addendum)
Pt c/o cough for approx 1 month-- non productive--  When asked about SI/HI in triage assessment-- pt states "it would not matter if I was" -- states was kicked out of house last  Night-- poor eye contact, denies SI/HI--  States does not want to talk to anyone about feeling down.

## 2016-08-23 NOTE — Discharge Instructions (Signed)
Take the medications as needed for coughing, follow-up with a primary care doctor.  Try to quit smoking as we discussed

## 2016-08-23 NOTE — ED Provider Notes (Signed)
[facility]EMERGENCY DEPT Provider Note   CSN: [id number] Arrival date & time: 08/23/16  [id number]     History   Chief Complaint Chief Complaint  Patient presents with  . Cough    HPI Philip Bautista is a 31 y.o. male.  HPI Patient presents to the emergency room for evaluation of chronic cough. Patient states he has been coughing for at least the last month or 2. Patient was seen back on May 29 with complaints of cough. He was diagnosed with bronchitis.  Patient states he has continued cough since then. The cough is mostly nonproductive but occasionally he brings up mucus. He smokes cigarettes. He denies any fevers or shortness of breath. No leg swelling. No weight loss. No hemoptysis. Past Medical History:  Diagnosis Date  . Obesity     There are no active problems to display for this patient.   History reviewed. No pertinent surgical history.     Home Medications    Prior to Admission medications   Medication Sig Start Date End Date Taking? Authorizing Provider  albuterol (PROVENTIL HFA;VENTOLIN HFA) 108 (90 Base) MCG/ACT inhaler Inhale 1-2 puffs into the lungs every 6 (six) hours as needed for wheezing or shortness of breath. 06/18/16   Deatra Canterxford, William J, FNP  azithromycin (ZITHROMAX) 250 MG tablet Take 1 tablet (250 mg total) by mouth daily. Take first 2 tablets together, then 1 every day until finished. 06/18/16   Deatra Canterxford, William J, FNP  benzonatate (TESSALON) 100 MG capsule Take 1 capsule (100 mg total) by mouth 3 (three) times daily as needed for cough. 08/23/16   Linwood DibblesKnapp, Othell Jaime, MD  methylPREDNISolone (MEDROL DOSEPAK) 4 MG TBPK tablet Take 6-5-4-3-2-1 po qd 06/18/16   Deatra Canterxford, William J, FNP  ondansetron (ZOFRAN ODT) 8 MG disintegrating tablet Take 1 tablet (8 mg total) by mouth every 8 (eight) hours as needed for nausea or vomiting. 11/26/15   Linwood DibblesKnapp, Tuan Tippin, MD  pantoprazole (PROTONIX) 40 MG tablet Take 1 tablet (40 mg total) by mouth daily. Patient not taking: Reported on 11/26/2015  11/07/15   Cathren LaineSteinl, Kevin, MD    Family History No family history on file.  Social History Social History  Substance Use Topics  . Smoking status: Current Every Day Smoker    Packs/day: 1.00    Types: Cigarettes  . Smokeless tobacco: Never Used  . Alcohol use Yes     Allergies   Lentil   Review of Systems Review of Systems  Psychiatric/Behavioral: Negative for suicidal ideas.       Depressed about being kicked out of his house  All other systems reviewed and are negative.    Physical Exam Updated Vital Signs BP 126/84 (BP Location: Right Arm)   Pulse 87   Temp 98.5 F (36.9 C) (Oral)   Resp 16   Ht 1.905 m (6\' 3" )   Wt 118.4 kg (261 lb)   SpO2 94%   BMI 32.62 kg/m   Physical Exam  Constitutional: He appears well-developed and well-nourished. No distress.  HENT:  Head: Normocephalic and atraumatic.  Right Ear: External ear normal.  Left Ear: External ear normal.  Eyes: Conjunctivae are normal. Right eye exhibits no discharge. Left eye exhibits no discharge. No scleral icterus.  Neck: Neck supple. No tracheal deviation present.  Cardiovascular: Normal rate, regular rhythm and intact distal pulses.   Pulmonary/Chest: Effort normal and breath sounds normal. No stridor. No respiratory distress. He has no wheezes. He has no rales.  Abdominal: Soft. Bowel sounds are normal. He  exhibits no distension. There is no tenderness. There is no rebound and no guarding.  Musculoskeletal: He exhibits no edema or tenderness.  Neurological: He is alert. He has normal strength. No cranial nerve deficit (no facial droop, extraocular movements intact, no slurred speech) or sensory deficit. He exhibits normal muscle tone. He displays no seizure activity. Coordination normal.  Skin: Skin is warm and dry. No rash noted.  Psychiatric: He has a normal mood and affect.  Nursing note and vitals reviewed.    ED Treatments / Results  Labs (all labs ordered are listed, but only abnormal  results are displayed) Labs Reviewed - No data to display   Radiology Dg Chest 2 View  Result Date: 08/23/2016 CLINICAL DATA:  Chronic cough EXAM: CHEST  2 VIEW COMPARISON:  November 07, 2015 FINDINGS: Lungs are clear. Heart size and pulmonary vascularity are normal. No adenopathy. No bone lesions. IMPRESSION: No edema or consolidation. Electronically Signed   By: Bretta BangWilliam  Woodruff III M.D.   On: 08/23/2016 08:17    Procedures Procedures (including critical care time)  Medications Ordered in ED Medications - No data to display   Initial Impression / Assessment and Plan / ED Course  I have reviewed the triage vital signs and the nursing notes.  Pertinent labs & imaging results that were available during my care of the patient were reviewed by me and considered in my medical decision making (see chart for details).  Clinical Course as of Aug 23 899  Fri Aug 23, 2016  [id number] Discussed CXR findings with patient.  Denies any other complaints or concerns right now.    [JK]    Clinical Course User Index [JK] Linwood DibblesKnapp, Odaliz Mcqueary, MD   Pt presents with persistent cough.  No PNA.  Exam is normal.  Recommended smoking cessation and follow up with PCP  Pt was kicked out of his house last nigth.  Seems depressed but denies SI or HI.    At this time there does not appear to be any evidence of an acute emergency medical condition and the patient appears stable for discharge with appropriate outpatient follow up.   Final Clinical Impressions(s) / ED Diagnoses   Final diagnoses:  Cough  Tobacco use    New Prescriptions New Prescriptions   BENZONATATE (TESSALON) 100 MG CAPSULE    Take 1 capsule (100 mg total) by mouth 3 (three) times daily as needed for cough.     Linwood DibblesKnapp, Trace Cederberg, MD 08/23/16 [phone]

## 2016-10-31 ENCOUNTER — Emergency Department (HOSPITAL_COMMUNITY): Payer: Self-pay

## 2016-10-31 ENCOUNTER — Encounter (HOSPITAL_COMMUNITY): Payer: Self-pay | Admitting: Emergency Medicine

## 2016-10-31 ENCOUNTER — Emergency Department (HOSPITAL_COMMUNITY)
Admission: EM | Admit: 2016-10-31 | Discharge: 2016-10-31 | Disposition: A | Discharge status: Home / Self Care | Payer: Self-pay | Attending: Emergency Medicine | Admitting: Emergency Medicine

## 2016-10-31 DIAGNOSIS — Y9361 Activity, american tackle football: Secondary | ICD-10-CM | POA: Insufficient documentation

## 2016-10-31 DIAGNOSIS — F17210 Nicotine dependence, cigarettes, uncomplicated: Secondary | ICD-10-CM | POA: Insufficient documentation

## 2016-10-31 DIAGNOSIS — Y929 Unspecified place or not applicable: Secondary | ICD-10-CM | POA: Insufficient documentation

## 2016-10-31 DIAGNOSIS — X58XXXA Exposure to other specified factors, initial encounter: Secondary | ICD-10-CM | POA: Insufficient documentation

## 2016-10-31 DIAGNOSIS — S4991XA Unspecified injury of right shoulder and upper arm, initial encounter: Secondary | ICD-10-CM

## 2016-10-31 DIAGNOSIS — Y999 Unspecified external cause status: Secondary | ICD-10-CM | POA: Insufficient documentation

## 2016-10-31 DIAGNOSIS — S40911A Unspecified superficial injury of right shoulder, initial encounter: Secondary | ICD-10-CM | POA: Insufficient documentation

## 2016-10-31 NOTE — ED Triage Notes (Signed)
Patient injured his right shoulder last Saturday , requesting medical clearance before gong back to work , denies pain , full ROM/no deformity .

## 2016-10-31 NOTE — ED Provider Notes (Signed)
[facility]EM[facility] DEPT Provider Note   CSN: [id number] Arrival date & time: 10/31/16  1941     History   Chief Complaint Chief Complaint  Patient presents with  . Shoulder Injury    HPI Philip Bautista is a 31 y.o. male who presents to the ED s/p shoulder injury 5 days ago while playing football. Patient states that he went to [facility] and his shoulder was dislocated and they popped it back in. He forgot to get a work note at the time and his work requires that he have a note before he returns. Patient states that he called [facility] to ask for the note and they told him they did not have him in the system so he came here. X-ray was not done at the previous visit to Urgent Care.  Patient reports that he is having no pain now and just needs a note saying he can return to work.   HPI  Past Medical History:  Diagnosis Date  . Obesity     There are no active problems to display for this patient.   History reviewed. No pertinent surgical history.     Home Medications    Prior to Admission medications   Medication Sig Start Date End Date Taking? Authorizing Provider  albuterol (PROVENTIL HFA;VENTOLIN HFA) 108 (90 Base) MCG/ACT inhaler Inhale 1-2 puffs into the lungs every 6 (six) hours as needed for wheezing or shortness of breath. 06/18/16   Deatra Canter, FNP  azithromycin (ZITHROMAX) 250 MG tablet Take 1 tablet (250 mg total) by mouth daily. Take first 2 tablets together, then 1 every day until finished. 06/18/16   Deatra Canter, FNP  benzonatate (TESSALON) 100 MG capsule Take 1 capsule (100 mg total) by mouth 3 (three) times daily as needed for cough. 08/23/16   Linwood Dibbles, MD  methylPREDNISolone (MEDROL DOSEPAK) 4 MG TBPK tablet Take 6-5-4-3-2-1 po qd 06/18/16   Deatra Canter, FNP  ondansetron (ZOFRAN ODT) 8 MG disintegrating tablet Take 1 tablet (8 mg total) by mouth every 8 (eight) hours as needed for nausea or vomiting. 11/26/15   Linwood Dibbles, MD    pantoprazole (PROTONIX) 40 MG tablet Take 1 tablet (40 mg total) by mouth daily. Patient not taking: Reported on 11/26/2015 11/07/15   Cathren Laine, MD    Family History No family history on file.  Social History Social History  Substance Use Topics  . Smoking status: Current Every Day Smoker    Packs/day: 1.00    Types: Cigarettes  . Smokeless tobacco: Never Used  . Alcohol use Yes     Allergies   Lentil   Review of Systems Review of Systems  Musculoskeletal: Positive for arthralgias.       Right shoulder that have resolved.   Skin: Negative for color change and wound.  All other systems reviewed and are negative.    Physical Exam Updated Vital Signs BP 122/76 (BP Location: Left Arm)   Pulse 60   Temp 98.3 F (36.8 C) (Oral)   Resp 15   Ht  (1.905 m)   Wt 118.8 kg (262 lb)   SpO2 100%   BMI 32.75 kg/m   Physical Exam  Constitutional: He appears well-developed and well-nourished. No distress.  HENT:  Head: Normocephalic.  Eyes: EOM are normal.  Neck: Neck supple.  Cardiovascular: Normal rate.   Pulmonary/Chest: Effort normal.  Musculoskeletal: Normal range of motion.       Right shoulder: He exhibits  normal range of motion, no tenderness, no swelling, no effusion, no crepitus, no deformity, no laceration, no pain, no spasm, normal pulse and normal strength.  Neurological: He is alert.  Skin: Skin is warm and dry.  Psychiatric: He has a normal mood and affect. His behavior is normal.  Nursing note and vitals reviewed.    ED Treatments / Results  Labs (all labs ordered are listed, but only abnormal results are displayed) Labs Reviewed - No data to display   Radiology Dg Shoulder Right  Result Date: 10/31/2016 CLINICAL DATA:  Sports injury, right shoulder dislocation reduced at an urgent care facility on 10/26/2016. EXAM: RIGHT SHOULDER - 2+ VIEW COMPARISON:  None. FINDINGS: There is no evidence of fracture or dislocation. There is no  evidence of arthropathy or other focal bone abnormality. Soft tissues are unremarkable. IMPRESSION: Negative. Electronically Signed   By: Ellery Plunk M.D.   On: 10/31/2016 20:58    Procedures Procedures (including critical care time)  Medications Ordered in ED Medications - No data to display   Initial Impression / Assessment and Plan / ED Course  I have reviewed the triage vital signs and the nursing notes. 31 y.o. male here s/p right shoulder injury 4 days ago stable for d/c without fracture, dislocation or AC separation noted on x-rays and patient without pain at this time. Patient will return to work without restrictions.   Final Clinical Impressions(s) / ED Diagnoses   Final diagnoses:  Injury of right shoulder, initial encounter    New Prescriptions Discharge Medication List as of 10/31/2016  9:18 PM       Janne Napoleon, NP 11/01/16 0040    Derwood Kaplan, MD 11/01/16 [phone]

## 2016-10-31 NOTE — Discharge Instructions (Signed)
Your x-ray today shows no fracture or dislocation or separation. You may go back to work without restrictions.

## 2017-10-14 ENCOUNTER — Other Ambulatory Visit: Payer: Self-pay

## 2017-10-14 ENCOUNTER — Emergency Department (HOSPITAL_COMMUNITY): Payer: Self-pay

## 2017-10-14 ENCOUNTER — Emergency Department (HOSPITAL_COMMUNITY)
Admission: EM | Admit: 2017-10-14 | Discharge: 2017-10-14 | Disposition: A | Discharge status: Home / Self Care | Payer: Self-pay | Attending: Emergency Medicine | Admitting: Emergency Medicine

## 2017-10-14 ENCOUNTER — Encounter (HOSPITAL_COMMUNITY): Payer: Self-pay

## 2017-10-14 DIAGNOSIS — S99921A Unspecified injury of right foot, initial encounter: Secondary | ICD-10-CM | POA: Insufficient documentation

## 2017-10-14 DIAGNOSIS — W228XXA Striking against or struck by other objects, initial encounter: Secondary | ICD-10-CM | POA: Insufficient documentation

## 2017-10-14 DIAGNOSIS — Y9389 Activity, other specified: Secondary | ICD-10-CM | POA: Insufficient documentation

## 2017-10-14 DIAGNOSIS — M79671 Pain in right foot: Secondary | ICD-10-CM

## 2017-10-14 DIAGNOSIS — Y998 Other external cause status: Secondary | ICD-10-CM | POA: Insufficient documentation

## 2017-10-14 DIAGNOSIS — F17210 Nicotine dependence, cigarettes, uncomplicated: Secondary | ICD-10-CM | POA: Insufficient documentation

## 2017-10-14 DIAGNOSIS — Y9289 Other specified places as the place of occurrence of the external cause: Secondary | ICD-10-CM | POA: Insufficient documentation

## 2017-10-14 NOTE — ED Notes (Signed)
ED Provider at bedside. 

## 2017-10-14 NOTE — ED Notes (Signed)
Patient transported to X-ray 

## 2017-10-14 NOTE — ED Provider Notes (Signed)
[facility] EMERGENCY DEPARTMENT Provider Note   CSN: [id number] Arrival date & time: 10/14/17  1248     History   Chief Complaint Chief Complaint  Patient presents with  . Foot Injury    HPI Philip Bautista is a 32 y.o. male presenting for right foot pain that began yesterday morning.  Patient states that a safe fell onto his foot causing immediate throbbing pain that is moderate in intensity and constant.  Patient states that he has taken ibuprofen for his pain with some relief.  Patient states that pain is worsened with palpation and ambulation.  Patient states that his foot began swelling shortly after the incident.  Patient denies fever, numbness/tingling, weakness.  Patient states that he went to work after the incident, had increased pain with ambulation throughout the day however was able to complete his job and ambulate requesting work note today.  Patient denies history of liver disease, gastric bleeding or kidney disease. HPI  Past Medical History:  Diagnosis Date  . Obesity     There are no active problems to display for this patient.   History reviewed. No pertinent surgical history.      Home Medications    Prior to Admission medications   Medication Sig Start Date End Date Taking? Authorizing Provider  pantoprazole (PROTONIX) 40 MG tablet Take 1 tablet (40 mg total) by mouth daily. Patient not taking: Reported on 11/26/2015 11/07/15   Cathren Laine, MD    Family History History reviewed. No pertinent family history.  Social History Social History   Tobacco Use  . Smoking status: Current Every Day Smoker    Packs/day: 1.00    Types: Cigarettes  . Smokeless tobacco: Never Used  Substance Use Topics  . Alcohol use: Yes  . Drug use: Yes    Types: Marijuana    Comment: last use x 1 month ago     Allergies   Lentil   Review of Systems Review of Systems  Constitutional: Negative.  Negative for chills, fatigue and fever.    Musculoskeletal: Positive for arthralgias and joint swelling. Negative for back pain, myalgias and neck pain.  Skin: Negative for wound.  Neurological: Negative.  Negative for weakness and numbness.   Physical Exam Updated Vital Signs BP 122/88 (BP Location: Right Arm)   Pulse (!) 51   Temp 98.3 F (36.8 C) (Oral)   Resp 14   Ht 6\' 3"  (1.905 m)   Wt 117 kg   SpO2 100%   BMI 32.25 kg/m   Physical Exam  Constitutional: He is oriented to person, place, and time. He appears well-developed and well-nourished. No distress.  HENT:  Head: Normocephalic and atraumatic.  Right Ear: External ear normal.  Left Ear: External ear normal.  Nose: Nose normal.  Eyes: Pupils are equal, round, and reactive to light. EOM are normal.  Neck: Trachea normal and normal range of motion. No tracheal deviation present.  Cardiovascular:  Pulses:      Dorsalis pedis pulses are 2+ on the right side, and 2+ on the left side.       Posterior tibial pulses are 2+ on the right side, and 2+ on the left side.  Pulmonary/Chest: Effort normal. No respiratory distress.  Abdominal: Soft. There is no tenderness. There is no rebound and no guarding.  Musculoskeletal: Normal range of motion.       Right foot: There is tenderness and swelling. There is normal capillary refill, no crepitus and no deformity.  Left foot: There is no deformity.       Feet:  Mild amount of swelling to top of right foot.  No surrounding ecchymosis or color change.  Area is tender to palpation.  Pedal pulses intact and equal bilaterally.  All compartments are soft.  Patient is able to dorsiflex and plantar flex with 5/5 strength and slightly increased pain with movement.  Patient is able to invert and evert the foot with full range of motion however with some increase in pain.  Capillary refill and sensation to light touch intact to all toes. No increased warmth or erythema suggestive of infection. Patient is able to ambulate without  difficulty or assistance.  Feet:  Right Foot:  Protective Sensation: 3 sites tested. 3 sites sensed.  Left Foot:  Protective Sensation: 3 sites tested. 3 sites sensed.  Neurological: He is alert and oriented to person, place, and time. He has normal strength. No sensory deficit. GCS eye subscore is 4. GCS verbal subscore is 5. GCS motor subscore is 6.  Speech is clear and goal oriented, follows commands Major Cranial nerves without deficit, no facial droop Normal strength in upper and lower extremities bilaterally including dorsiflexion and plantar flexion, strong and equal grip strength Sensation normal to light and sharp touch Moves extremities without ataxia, coordination intact Normal gait  Skin: Skin is warm and dry. Capillary refill takes less than 2 seconds.  Psychiatric: He has a normal mood and affect. His behavior is normal.    ED Treatments / Results  Labs (all labs ordered are listed, but only abnormal results are displayed) Labs Reviewed - No data to display  EKG None  Radiology Dg Ankle Complete Right  Result Date: 10/14/2017 CLINICAL DATA:  Right ankle pain after injury yesterday. EXAM: RIGHT ANKLE - COMPLETE 3+ VIEW COMPARISON:  None. FINDINGS: There is no evidence of fracture, dislocation, or joint effusion. There is no evidence of arthropathy or other focal bone abnormality. Soft tissues are unremarkable. IMPRESSION: Normal right ankle. Electronically Signed   By: Lupita Raider, M.D.   On: 10/14/2017 15:39   Dg Foot Complete Right  Result Date: 10/14/2017 CLINICAL DATA:  Acute right foot pain after injury yesterday. EXAM: RIGHT FOOT COMPLETE - 3+ VIEW COMPARISON:  Radiographs of Jun 07, 2017. FINDINGS: There is no evidence of fracture or dislocation. There is no evidence of arthropathy or other focal bone abnormality. Soft tissues are unremarkable. IMPRESSION: No significant abnormality seen in the right foot. Electronically Signed   By: Lupita Raider, M.D.   On:  10/14/2017 15:41    Procedures Procedures (including critical care time)  Medications Ordered in ED Medications - No data to display   Initial Impression / Assessment and Plan / ED Course  I have reviewed the triage vital signs and the nursing notes.  Pertinent labs & imaging results that were available during my care of the patient were reviewed by me and considered in my medical decision making (see chart for details).   Patient presenting 1 day after dropping heavy object on right foot.  Mild swelling present however no obvious deformity.  Imaging of right foot and right ankle without acute findings.  Patient is neurovascularly intact to the foot with full range of motion however with some increased pain.  All compartments are soft.  Patient has been provided with postop shoe in department today.  Offered crutches however he refused.  Patient has been provided with orthopedic referral for further evaluation.  Patient is  afebrile, not tachycardic, not hypotensive, well-appearing and in no acute distress.  Patient has been informed of imaging findings, no fractures however still possibility of small tendon/ligament injury and need for orthopedic follow-up.  Rest, ice and elevation have been encouraged as well as over-the-counter anti-inflammatory medications such as ibuprofen and Tylenol as directed on the packaging.  Patient denies history of gastric ulcers, kidney disease or liver disease.  At this time there does not appear to be any evidence of an acute emergency medical condition and the patient appears stable for discharge with appropriate outpatient follow up. Diagnosis was discussed with patient who verbalizes understanding of care plan and is agreeable to discharge. I have discussed return precautions with patient and wife at bedside who verbalize understanding of return precautions. Patient strongly encouraged to follow-up with their PCP. All questions answered.    Note: Portions of  this report may have been transcribed using voice recognition software. Every effort was made to ensure accuracy; however, inadvertent computerized transcription errors may still be present.  Final Clinical Impressions(s) / ED Diagnoses   Final diagnoses:  Right foot pain    ED Discharge Orders    None       Elizabeth PalauMorelli, Shaquan Puerta A, PA-C 10/14/17 1718    Mesner, Barbara CowerJason, MD 10/14/17 1719

## 2017-10-14 NOTE — ED Triage Notes (Signed)
Pt states he was helping move furniture yesterday and a 900lb safe landed on his foot. Limited swelling noted.

## 2017-10-14 NOTE — Discharge Instructions (Addendum)
Please return to the Emergency Department for any new or worsening symptoms or if your symptoms do not improve. Please be sure to follow up with your Primary Care Physician as soon as possible regarding your visit today. If you do not have a Primary Doctor please use the resources below to establish one. Please use rest, ice and elevation to help with your pain.  He may use over-the-counter anti-inflammatory such as Tylenol and ibuprofen as directed on the packaging to help with your pain. Please follow-up with [place] orthopedics for further evaluation and treatment of your foot pain.  Your x-ray today was negative for fractures however injuries to tendons and ligaments could still be present. Contact a health care provider if: Your pain does not get better after a few days of self-care. Your pain gets worse. You cannot stand on your foot. Get help right away if: Your foot is numb or tingling. Your foot or toes are swollen. Your foot or toes turn white or blue. You have warmth and redness along your foot. Do not take your medicine if  develop an itchy rash, swelling in your mouth or lips, or difficulty breathing.   RESOURCE GUIDE  Chronic Pain Problems: Contact [facility]  [phone] Patients need to be referred by their primary care doctor.  Insufficient Money for Medicine: Contact United Way:  call "211" or [facility] 4321488541.  No Primary Care Doctor: Call Health Connect  [phone] - can help you locate a primary care doctor that  accepts your insurance, provides certain services, etc. Physician Referral Service- [phone]  Agencies that provide inexpensive medical care: [facility]  [phone] [facility]  [phone] [facility]  [phone] [facility]  [phone] Planned [facility]  [phone] [facility]  [phone]  Medicaid-accepting [facility] Providers: [facility]-  [address]  [phone], Mon-Fri 9am-7pm, Sat 9am-1pm [facility]- [address] [place], Suite [place]  [phone] [facility]- [address], [address] [place]  [phone] [facility]- [address]  [phone] Renaye Rakers- [address] [place], [address], [phone]  Only accepts [place] patients after they have their name  applied to their card  Self Pay (no insurance) in [facility]: Sickle Cell Patients: Dr Willey Blade, [facility]  [address] [place], [phone] [facility]- [address] [place]  [phone]       [facility] [place]- [address]       -     [facility]- see information above (Speak to [employer] if you do not have insurance)       -  [facility]- [address] [place], [phone]       -  [facility]- [address],  [phone]       -  [facility]- [address] [phone]       -  Dr Julio Sicks-  [address], [address], [place], [phone]       -  [facility]- [address] [phone]       -  [facility]- [address], [phone], also [address] [phone]       -    [facility]- [address]  [place], [phone], 1st & 3rd Saturday   every month, 10am-1pm  1) Find a Doctor and Pay Out of Pocket Although you won't have to find out who is covered by your insurance plan, it is a good idea to ask around and get recommendations. You will then need to call the office and see if the doctor you have chosen will accept you as a new patient and what types of options they offer for patients who are self-pay. Some doctors offer discounts or will set up payment plans for their patients who do not have insurance, but you will need to ask so you aren't surprised when you get to your appointment.  2) Contact Your Local Health Department Not all  health departments have doctors that can see patients for sick visits, but many do, so it is worth a call to see if yours does. If you don't know where your local health department is, you can check in your phone book. The CDC also has a tool to help you locate your state's health department, and many state websites also have listings of all of their local health departments.  3) Find a Walk-in Clinic If your illness is not likely to be very severe or complicated, you may want to try a walk in clinic. These are popping up all over the country in pharmacies, drugstores, and shopping centers. They're usually staffed by nurse practitioners or physician assistants that have been trained to treat common illnesses and complaints. They're usually fairly quick and inexpensive. However, if you have serious medical issues or chronic medical problems, these are probably not your best option  STD Testing [facility] of [facility] [place], STD Clinic, [address], [place], phone [phone] or [phone].  Monday - Friday, call for an appointment. [facility] of [facility] [place], STD Clinic, [place], [place], phone [phone] or [phone].  Monday - Friday, call for an appointment.  Abuse/Neglect: [facility] Child Abuse Hotline 458-347-2302(336) 262-198-9856 [facility] Child Abuse Hotline [phone] (After Hours)  Emergency Shelter:  [facility] [phone](336) [phone]  Maternity Homes: Room at the [facility] [place] [facility] [phone](336) [phone] [facility] [phone]  MRSA Hotline #:   901-613-1552801-399-5114  [facility] Resources  [facility] [place]  United Way [facility] [address]                 [address]         [address]  [place]                                               [place]                              [place] Phone:  [phone]                                   Phone:  [phone]                   Phone:  [phone]  [facility], [phone] [facility] Services - [phone]- [phone]       -  [facility] in [place], [address]                                  [place] [phone] or [phone] (After Hours)   [place]  Substance Abuse Resources: Alcohol and Drug Services  [place] [phone] [facility] Chinita Pester [phone] Residential & Outpatient Substance Abuse Program  [phone]  Psychological Services: [place]  [phone] [place]  [place], [place] [address], [place], [place]: [phone] or [phone], Pic[employer]uy  Dental Assistance  If unable to pay or uninsured, contact:  [facility] or [facility]. to become qualified for the adult dental clinic.  Patients with Medicaid: [facility] [phone], [place] [address] [phone]  If unable to pay, or uninsured, contact [facility] [phone]) or [place] [phone] in [place], [place] in [facility]) to become qualified for the adult dental clinic   Other [place]- [place], [place], [place], [address], [place], [place], 2nd and 4th Thursday of the month at 6:30am.  10 clients each day by appointment, can sometimes see walk-in patients if someone does not show for an appointment. [facility]- [address] Hillard Danker [place], [place], [address], [place], [place], [place], [address], [place] Department- [phone] [facility] [phone]

## 2017-10-14 NOTE — ED Notes (Signed)
Patient verbalizes understanding of discharge instructions. Opportunity for questioning and answers were provided. Armband removed by staff, pt discharged from ED ambulatory.   

## 2017-10-16 ENCOUNTER — Ambulatory Visit (INDEPENDENT_AMBULATORY_CARE_PROVIDER_SITE_OTHER): Payer: Self-pay | Admitting: Orthopaedic Surgery

## 2017-10-17 ENCOUNTER — Ambulatory Visit (INDEPENDENT_AMBULATORY_CARE_PROVIDER_SITE_OTHER): Payer: Self-pay | Admitting: Orthopaedic Surgery

## 2017-10-17 ENCOUNTER — Encounter (INDEPENDENT_AMBULATORY_CARE_PROVIDER_SITE_OTHER): Payer: Self-pay | Admitting: Orthopaedic Surgery

## 2017-10-17 DIAGNOSIS — S9031XA Contusion of right foot, initial encounter: Secondary | ICD-10-CM

## 2017-10-17 NOTE — Progress Notes (Signed)
   Office Visit Note   Patient: Philip Bautista           Date of Birth: 07-17-1985           MRN: [id number] Visit Date: 10/17/2017              Requested by: No referring provider defined for this encounter. PCP: Patient, No Pcp Per   Assessment & Plan: Visit Diagnoses:  1. Contusion of right foot, initial encounter     Plan: Impression is right foot/ankle contusion.  The patient will take it easy over the next few days.  A work note was written to be out of work until Monday.  He will ice and take over-the-counter anti-inflammatories for pain and swelling.  Follow-up with [place] in 4 weeks time for recheck.  If he is doing very well without any issues he will call and let us know and cancel the appointment.  Follow-Up Instructions: Return in about 4 weeks (around 11/14/2017).   Orders:  No orders of the defined types were placed in this encounter.  No orders of the defined types were placed in this encounter.     Procedures: No procedures performed   Clinical Data: No additional findings.   Subjective: Chief Complaint  Patient presents with  . Right Foot - Pain    HPI patient is a pleasant 32 year old gentleman who presents to our clinic today with right foot/ankle pain.  On 10/13/2017 he was at work when a heavy metal dolly came down on the anterior aspect of his ankle.  This did not seem to cause too much pain initially, but he had increased pain the following day.  He was seen in the ED where x-rays of the right foot and ankle were obtained.  These were negative for fracture.  Pain he has is to the anterior aspect of the ankle worse with plantar flexion.  No pain at rest.  Is not taking any medication for this.  No numbness, tingling or burning.  Review of Systems as detailed in HPI.  All others reviewed and are negative.    Objective: Vital Signs: There were no vitals taken for this visit.  Physical Exam well-developed well-nourished gentleman in no acute distress.   Alert and oriented x3.  Ortho Exam examination of the right foot reveals mild swelling.  Minimal tenderness to the anterior ankle.  Minimal ecchymosis.  Full range of motion of the ankle although this is minimally painful with plantar flexion.  Specialty Comments:  No specialty comments available.  Imaging: No new imaging   PMFS History: Patient Active Problem List   Diagnosis Date Noted  . Contusion of right foot 10/17/2017   Past Medical History:  Diagnosis Date  . Obesity     History reviewed. No pertinent family history.  History reviewed. No pertinent surgical history. Social History   Occupational History  . Not on file  Tobacco Use  . Smoking status: Current Every Day Smoker    Packs/day: 1.00    Types: Cigarettes  . Smokeless tobacco: Never Used  Substance and Sexual Activity  . Alcohol use: Yes  . Drug use: Yes    Types: Marijuana    Comment: last use x 1 month ago  . Sexual activity: Not on file

## 2018-01-22 ENCOUNTER — Emergency Department (HOSPITAL_COMMUNITY): Payer: PRIVATE HEALTH INSURANCE

## 2018-01-22 ENCOUNTER — Encounter (HOSPITAL_COMMUNITY): Payer: Self-pay

## 2018-01-22 ENCOUNTER — Emergency Department (HOSPITAL_COMMUNITY)
Admission: EM | Admit: 2018-01-22 | Discharge: 2018-01-23 | Disposition: A | Discharge status: Home / Self Care | Payer: PRIVATE HEALTH INSURANCE | Attending: Emergency Medicine | Admitting: Emergency Medicine

## 2018-01-22 DIAGNOSIS — R55 Syncope and collapse: Secondary | ICD-10-CM

## 2018-01-22 DIAGNOSIS — F17210 Nicotine dependence, cigarettes, uncomplicated: Secondary | ICD-10-CM | POA: Insufficient documentation

## 2018-01-22 LAB — COMPREHENSIVE METABOLIC PANEL
ALT: 25 U/L (ref 0–44)
AST: 29 U/L (ref 15–41)
Albumin: 3.6 g/dL (ref 3.5–5.0)
Alkaline Phosphatase: 30 U/L — ABNORMAL LOW (ref 38–126)
Anion gap: 10 (ref 5–15)
BUN: 8 mg/dL (ref 6–20)
CO2: 24 mmol/L (ref 22–32)
Calcium: 8.5 mg/dL — ABNORMAL LOW (ref 8.9–10.3)
Chloride: 108 mmol/L (ref 98–111)
Creatinine, Ser: 1.3 mg/dL — ABNORMAL HIGH (ref 0.61–1.24)
GFR calc Af Amer: >60 mL/min (ref >60)
GFR calc non Af Amer: >60 mL/min (ref >60)
Glucose, Bld: 129 mg/dL — ABNORMAL HIGH (ref 70–99)
Potassium: 3.1 mmol/L — ABNORMAL LOW (ref 3.5–5.1)
Sodium: 142 mmol/L (ref 135–145)
Total Bilirubin: 0.8 mg/dL (ref 0.3–1.2)
Total Protein: 5.8 g/dL — ABNORMAL LOW (ref 6.5–8.1)

## 2018-01-22 LAB — CBC WITH DIFFERENTIAL/PLATELET
Abs Immature Granulocytes: 0.03 10*3/uL (ref 0.00–0.07)
BASOS ABS: 0 10*3/uL (ref 0.0–0.1)
Basophils Relative: 0 %
EOS PCT: 1 %
Eosinophils Absolute: 0.1 10*3/uL (ref 0.0–0.5)
HCT: 42.3 % (ref 39.0–52.0)
Hemoglobin: 13.7 g/dL (ref 13.0–17.0)
Immature Granulocytes: 0 %
Lymphocytes Relative: 23 %
Lymphs Abs: 2.1 10*3/uL (ref 0.7–4.0)
MCH: 27.5 pg (ref 26.0–34.0)
MCHC: 32.4 g/dL (ref 30.0–36.0)
MCV: 84.8 fL (ref 80.0–100.0)
Monocytes Absolute: 0.9 10*3/uL (ref 0.1–1.0)
Monocytes Relative: 9 %
Neutro Abs: 6.2 10*3/uL (ref 1.7–7.7)
Neutrophils Relative %: 67 %
Platelets: 305 10*3/uL (ref 150–400)
RBC: 4.99 MIL/uL (ref 4.22–5.81)
RDW: 14.9 % (ref 11.5–15.5)
WBC: 9.4 10*3/uL (ref 4.0–10.5)
nRBC: 0 % (ref 0.0–0.2)

## 2018-01-22 LAB — CBG MONITORING, ED: Glucose-Capillary: 129 mg/dL — ABNORMAL HIGH (ref 70–99)

## 2018-01-22 LAB — MAGNESIUM: Magnesium: 2 mg/dL (ref 1.7–2.4)

## 2018-01-22 MED ORDER — LACTATED RINGERS IV BOLUS
1000.0000 mL | Freq: Once | INTRAVENOUS | Status: AC
  Administered 2018-01-22: 1000 mL via INTRAVENOUS

## 2018-01-22 MED ORDER — POTASSIUM CHLORIDE CRYS ER 20 MEQ PO TBCR
40.0000 meq | EXTENDED_RELEASE_TABLET | Freq: Once | ORAL | Status: AC
  Administered 2018-01-22: 40 meq via ORAL
  Filled 2018-01-22: qty 2

## 2018-01-22 NOTE — ED Triage Notes (Signed)
[place] [facility] [place] after having 3 syncopal episodes, denies pain, reports not eating today.

## 2018-01-22 NOTE — ED Provider Notes (Signed)
[facility]   CSN: [id number] Arrival date & time: 01/22/18  2122     History   Chief Complaint Chief Complaint  Patient presents with  . Loss of Consciousness    HPI Philip Bautista is a 33 y.o. male.  HPI   Patient is a 33 year old male without significant past medical history presents via EMS from a shopping mall for further evaluation of 3 episodes of syncope.  His episodes were witnessed by family members.  Patient reportedly became unresponsive and fell each time 1 time striking the right side of his face against a countertop.  Patient does not recall the details of these episodes.  He reportedly became conscious within a minute after falling to the ground.  In addition EMS notes that in route he had some flexion movements of his upper extremities but remained interactive throughout these.  On presentation patient states he feels lightheaded and does not recall the events that brought him to the emergency department but denies any recent illnesses including fevers, vomiting, diarrhea, dysuria, cough, chest pain, or shortness of breath.  He denies any prior similar episodes of syncope or seizure history.  He denies any bowel or bladder incontinence.  States he smokes cannabis but denies daily EtOH or other illicit drug use.  Denies any recent traumatic injuries.  Denies any history of DVT/PE, seizures, or preceding sensations of warmth, visual changes, or nausea.  Past Medical History:  Diagnosis Date  . Obesity     Patient Active Problem List   Diagnosis Date Noted  . Contusion of right foot 10/17/2017    History reviewed. No pertinent surgical history.      Home Medications    Prior to Admission medications   Medication Sig Start Date End Date Taking? Authorizing Provider  pantoprazole (PROTONIX) 40 MG tablet Take 1 tablet (40 mg total) by mouth daily. Patient not taking: Reported on 11/26/2015 11/07/15   Cathren LaineSteinl,  Kevin, MD    Family History No family history on file.  Social History Social History   Tobacco Use  . Smoking status: Current Every Day Smoker    Packs/day: 1.00    Types: Cigarettes  . Smokeless tobacco: Never Used  Substance Use Topics  . Alcohol use: Yes  . Drug use: Yes    Types: Marijuana    Comment: last use x 1 month ago     Allergies   Lentil   Review of Systems Review of Systems  Constitutional: Negative for chills and fever.  HENT: Negative for ear pain and sore throat.   Eyes: Negative for pain and visual disturbance.  Respiratory: Negative for cough and shortness of breath.   Cardiovascular: Negative for chest pain and palpitations.  Gastrointestinal: Negative for abdominal pain and vomiting.  Genitourinary: Negative for dysuria and hematuria.  Musculoskeletal: Negative for arthralgias and back pain.  Skin: Negative for color change and rash.  Neurological: Positive for syncope and light-headedness. Negative for seizures.  All other systems reviewed and are negative.    Physical Exam Updated Vital Signs BP 97/61   Pulse 64   Temp 98.1 F (36.7 C) (Oral)   Resp (!) 22   SpO2 95%   Physical Exam Vitals signs and nursing note reviewed.  Constitutional:      Appearance: Normal appearance. He is well-developed and normal weight.  HENT:     Head: Normocephalic and atraumatic.     Right Ear: External ear normal.  Left Ear: External ear normal.     Mouth/Throat:     Mouth: Mucous membranes are dry.  Eyes:     Conjunctiva/sclera: Conjunctivae normal.  Neck:     Musculoskeletal: Neck supple.  Cardiovascular:     Rate and Rhythm: Normal rate and regular rhythm.     Pulses: Normal pulses.     Heart sounds: No murmur.  Pulmonary:     Effort: Pulmonary effort is normal. No respiratory distress.     Breath sounds: Normal breath sounds.  Abdominal:     Palpations: Abdomen is soft.     Tenderness: There is no abdominal tenderness.  Skin:     General: Skin is warm and dry.     Capillary Refill: Capillary refill takes 2 to 3 seconds.  Neurological:     General: No focal deficit present.     Mental Status: He is alert. Mental status is at baseline.   No tongue laceration is noted.  Patient has some mild tenderness palpation over his right  zygomatic bone and and right mandible.   ED Treatments / Results  Labs (all labs ordered are listed, but only abnormal results are displayed) Labs Reviewed  COMPREHENSIVE METABOLIC PANEL - Abnormal; Notable for the following components:      Result Value   Potassium 3.1 (*)    Glucose, Bld 129 (*)    Creatinine, Ser 1.30 (*)    Calcium 8.5 (*)    Total Protein 5.8 (*)    Alkaline Phosphatase 30 (*)    All other components within normal limits  CBG MONITORING, ED - Abnormal; Notable for the following components:   Glucose-Capillary 129 (*)    All other components within normal limits  CBC WITH DIFFERENTIAL/PLATELET  MAGNESIUM    EKG EKG Interpretation  Date/Time:  Thursday January 22 2018 21:29:16 EST Ventricular Rate:  60 PR Interval:    QRS Duration: 114 QT Interval:  403 QTC Calculation: 403 R Axis:   20 Text Interpretation:  Sinus rhythm Borderline intraventricular conduction delay RSR' in V1 or V2, right VCD or RVH no significant change since 2017 Confirmed by Pricilla Loveless [phone]) on 01/22/2018 9:33:50 PM   Radiology Dg Chest 2 View  Result Date: 01/22/2018 CLINICAL DATA:  Syncope. EXAM: CHEST - 2 VIEW COMPARISON:  Chest x-ray dated August 23, 2016. FINDINGS: The heart size and mediastinal contours are within normal limits. Normal pulmonary vascularity. No focal consolidation, pleural effusion, or pneumothorax. No acute osseous abnormality. IMPRESSION: No active cardiopulmonary disease. Electronically Signed   By: Obie Dredge M.D.   On: 01/22/2018 22:18   Ct Maxillofacial Wo Contrast  Result Date: 01/22/2018 CLINICAL DATA:  33 y/o  M; syncopal episode with facial  trauma. EXAM: CT MAXILLOFACIAL WITHOUT CONTRAST TECHNIQUE: Multidetector CT imaging of the maxillofacial structures was performed. Multiplanar CT image reconstructions were also generated. COMPARISON:  None. FINDINGS: Osseous: No fracture or mandibular dislocation. No destructive process. Orbits: Negative. No traumatic or inflammatory finding. Sinuses: Clear. Soft tissues: Negative. Limited intracranial: No significant or unexpected finding. IMPRESSION: No acute facial fracture or mandibular dislocation. Electronically Signed   By: Mitzi Hansen M.D.   On: 01/22/2018 23:23    Procedures Procedures (including critical care time)  Medications Ordered in ED Medications  lactated ringers bolus 1,000 mL (0 mLs Intravenous Stopped 01/22/18 2238)  potassium chloride SA (K-DUR,KLOR-CON) CR tablet 40 mEq (40 mEq Oral Given 01/22/18 2244)  lactated ringers bolus 1,000 mL (0 mLs Intravenous Stopped 01/22/18 2352)  Initial Impression / Assessment and Plan / ED Course  I have reviewed the triage vital signs and the nursing notes.  Pertinent labs & imaging results that were available during my care of the patient were reviewed by me and considered in my medical decision making (see chart for details).    Patient is a [age] year old male who presents with above-stated history exam.  On presentation patient is afebrile stable vital signs.  Exam as above remarkable for no focal neurological deficit with intact extremity pulses.  ECG shows sinus rhythm with a ventricular rate of 60.  Patient with normal QRS and QTc intervals and no signs of acute ischemic change.  Given absence of chest pain, age, and absence of risk factors, and nonischemic EKG I have a low suspicion for ACS.  Doubt PE as patient is PERC negative.  Chest x-ray does not show pneumonia, pulmonary edema, pleural effusion, or pneumothorax.  Regarding the patient's report of history of trauma to the face and tenderness on exam have low suspicion  for occult fracture given CT maxillofacial shows no acute fractures.  CMP shows a potassium of 3.1 and a creatinine of 1.3 compared to 1.052 years ago with otherwise no significant electrolyte or metabolic abnormalities.  Potassium was repleted.  Doubt symptomatic anemia given CBC shows a WBC count of 9.4, hemoglobin 13.7, platelets 305.  History exam is less consistent with seizure and more consistent with syncope.  I believe the most likely cause of the patient's syncope is hypovolemia given dry mucous membranes, elevated creatinine, and borderline low blood pressures on arrival.  In addition patient does not provide a history consistent with vasovagal syncope.  Patient given 2 L IV fluids in the emergency department and potassium with significant improvement in his lightheadedness.  Patient discharged in stable condition.  Strict return precautions advised and discussed.  Patient instructed to follow-up with PCP in 3 to 5 days.  Patient voiced understanding and agreement with this plan prior to discharge.  Final Clinical Impressions(s) / ED Diagnoses   Final diagnoses:  Syncope and collapse    ED Discharge Orders    None       Antoine PrimasSmith, Elisabeth Strom, MD 01/22/18 2357    Pricilla LovelessGoldston, Scott, MD 01/23/18 [phone]

## 2018-01-22 NOTE — ED Notes (Signed)
Pt ambulated in hallway, no complaints of dizziness, oxygen remained at 100% RA, HR dropped to the 40's while ambulating and then returned to the 60's once laying down.

## 2019-05-12 ENCOUNTER — Other Ambulatory Visit: Payer: Self-pay

## 2019-05-12 ENCOUNTER — Inpatient Hospital Stay (HOSPITAL_COMMUNITY)
Admission: EM | Admit: 2019-05-12 | Discharge: 2019-05-14 | DRG: 558 | Discharge status: Against Medical Advice | Payer: Self-pay | Attending: Internal Medicine | Admitting: Internal Medicine

## 2019-05-12 ENCOUNTER — Emergency Department (HOSPITAL_COMMUNITY): Payer: Self-pay

## 2019-05-12 ENCOUNTER — Encounter (HOSPITAL_COMMUNITY): Payer: Self-pay

## 2019-05-12 DIAGNOSIS — B179 Acute viral hepatitis, unspecified: Secondary | ICD-10-CM

## 2019-05-12 DIAGNOSIS — Z20822 Contact with and (suspected) exposure to covid-19: Secondary | ICD-10-CM | POA: Diagnosis present

## 2019-05-12 DIAGNOSIS — J45909 Unspecified asthma, uncomplicated: Secondary | ICD-10-CM | POA: Diagnosis present

## 2019-05-12 DIAGNOSIS — Z79899 Other long term (current) drug therapy: Secondary | ICD-10-CM

## 2019-05-12 DIAGNOSIS — R509 Fever, unspecified: Secondary | ICD-10-CM

## 2019-05-12 DIAGNOSIS — M6282 Rhabdomyolysis: Principal | ICD-10-CM | POA: Diagnosis present

## 2019-05-12 DIAGNOSIS — F17210 Nicotine dependence, cigarettes, uncomplicated: Secondary | ICD-10-CM | POA: Diagnosis present

## 2019-05-12 DIAGNOSIS — J019 Acute sinusitis, unspecified: Secondary | ICD-10-CM | POA: Diagnosis present

## 2019-05-12 LAB — COMPREHENSIVE METABOLIC PANEL
ALT: 256 U/L — ABNORMAL HIGH (ref 0–44)
AST: 721 U/L — ABNORMAL HIGH (ref 15–41)
Albumin: 3.6 g/dL (ref 3.5–5.0)
Alkaline Phosphatase: 59 U/L (ref 38–126)
Anion gap: 10 (ref 5–15)
BUN: 12 mg/dL (ref 6–20)
CO2: 24 mmol/L (ref 22–32)
Calcium: 8.5 mg/dL — ABNORMAL LOW (ref 8.9–10.3)
Chloride: 99 mmol/L (ref 98–111)
Creatinine, Ser: 1.11 mg/dL (ref 0.61–1.24)
GFR calc Af Amer: >60 mL/min (ref >60)
GFR calc non Af Amer: >60 mL/min (ref >60)
Glucose, Bld: 89 mg/dL (ref 70–99)
Potassium: 3.7 mmol/L (ref 3.5–5.1)
Sodium: 133 mmol/L — ABNORMAL LOW (ref 135–145)
Total Bilirubin: 0.4 mg/dL (ref 0.3–1.2)
Total Protein: 7.4 g/dL (ref 6.5–8.1)

## 2019-05-12 LAB — LACTIC ACID, PLASMA: Lactic Acid, Venous: 1.9 mmol/L (ref 0.5–1.9)

## 2019-05-12 LAB — PROTIME-INR
INR: 0.9 (ref 0.8–1.2)
Prothrombin Time: 12.2 seconds (ref 11.4–15.2)

## 2019-05-12 LAB — APTT: aPTT: 33 seconds (ref 24–36)

## 2019-05-12 MED ORDER — SODIUM CHLORIDE 0.9 % IV BOLUS
1000.0000 mL | Freq: Once | INTRAVENOUS | Status: AC
  Administered 2019-05-12: 1000 mL via INTRAVENOUS

## 2019-05-12 MED ORDER — SODIUM CHLORIDE 0.9 % IV SOLN
2.0000 g | Freq: Once | INTRAVENOUS | Status: AC
  Administered 2019-05-13: 2 g via INTRAVENOUS
  Filled 2019-05-12: qty 2

## 2019-05-12 MED ORDER — VANCOMYCIN HCL 2000 MG/400ML IV SOLN
2000.0000 mg | Freq: Once | INTRAVENOUS | Status: AC
  Administered 2019-05-13: 2000 mg via INTRAVENOUS
  Filled 2019-05-12: qty 400

## 2019-05-12 MED ORDER — ACETAMINOPHEN 325 MG PO TABS
650.0000 mg | ORAL_TABLET | Freq: Once | ORAL | Status: AC | PRN
  Administered 2019-05-12: 650 mg via ORAL
  Filled 2019-05-12: qty 2

## 2019-05-12 MED ORDER — METRONIDAZOLE IN NACL 5-0.79 MG/ML-% IV SOLN
500.0000 mg | Freq: Once | INTRAVENOUS | Status: AC
  Administered 2019-05-13: 500 mg via INTRAVENOUS
  Filled 2019-05-12: qty 100

## 2019-05-12 MED ORDER — VANCOMYCIN HCL IN DEXTROSE 1-5 GM/200ML-% IV SOLN: 1000.0000 mg | Freq: Once | INTRAVENOUS | Status: DC

## 2019-05-12 NOTE — ED Provider Notes (Signed)
[facility]-EMERGENCY DEPT Provider Note   CSN: [id number] Arrival date & time: 05/12/19  2136     History Chief Complaint  Patient presents with  . Generalized Body Aches  . Fever  . Dysuria    Philip [facility] is a 34 y.o. male with a hx of childhood asthma presents to the Emergency Department complaining of gradual, persistent, progressively worsening body aches, chills, intermittent headache onset approximately 3 weeks ago. Associated symptoms include nausea without vomiting, shortness of breath, cough, nasal congestion, fatigue.  Patient reports he has been taking cough and cold medication and sinus medication without significant improvement.  He denies known sick contacts or known Covid contacts.  Patient reports that exertion and coughing make his shortness of breath worse.  Nothing seems to make it better.  He denies neck pain, neck stiffness, chest pain, focal abdominal pain, dizziness, syncope, dysuria.  He does report his urine is dark but is not malodorous.   The history is provided by the patient and medical records. No language interpreter was used.       Past Medical History:  Diagnosis Date  . Obesity     Patient Active Problem List   Diagnosis Date Noted  . Contusion of right foot 10/17/2017    History reviewed. No pertinent surgical history.     History reviewed. No pertinent family history.  Social History   Tobacco Use  . Smoking status: Current Every Day Smoker    Packs/day: 1.00    Types: Cigarettes  . Smokeless tobacco: Never Used  Substance Use Topics  . Alcohol use: Yes  . Drug use: Yes    Types: Marijuana    Comment: last use x 1 month ago    Home Medications Prior to Admission medications   Medication Sig Start Date End Date Taking? Authorizing Provider  cholecalciferol (VITAMIN D3) 25 MCG (1000 UNIT) tablet Take 1,000 Units by mouth daily.   Yes [provider]  loratadine (CLARITIN) 10 MG tablet Take  10 mg by mouth daily.   Yes [provider]  pantoprazole (PROTONIX) 40 MG tablet Take 1 tablet (40 mg total) by mouth daily. Patient not taking: Reported on 11/26/2015 11/07/15   Cathren Laine, MD    Allergies    Lentil  Review of Systems   Review of Systems  Constitutional: Positive for fever. Negative for appetite change, diaphoresis, fatigue and unexpected weight change.  HENT: Positive for postnasal drip, rhinorrhea, sinus pressure and sore throat. Negative for mouth sores.   Eyes: Negative for visual disturbance.  Respiratory: Positive for cough, chest tightness and shortness of breath. Negative for wheezing.   Cardiovascular: Negative for chest pain.  Gastrointestinal: Positive for nausea. Negative for abdominal pain, constipation, diarrhea and vomiting.  Endocrine: Negative for polydipsia, polyphagia and polyuria.  Genitourinary: Negative for dysuria, flank pain, frequency, hematuria and urgency.       Dark urine  Musculoskeletal: Positive for myalgias. Negative for back pain, neck pain and neck stiffness.  Skin: Negative for rash.  Allergic/Immunologic: Negative for immunocompromised state.  Neurological: Positive for headaches ( intermittent). Negative for syncope and light-headedness.  Hematological: Does not bruise/bleed easily.  Psychiatric/Behavioral: Negative for sleep disturbance. The patient is not nervous/anxious.     Physical Exam Updated Vital Signs BP (!) 145/89 (BP Location: Left Arm)   Pulse (!) 106   Temp (!) 101.6 F (38.7 C) (Oral)   Resp 18   SpO2 95%   Physical Exam Vitals and nursing note reviewed.  Constitutional:      General: He is not in acute distress.    Appearance: He is ill-appearing. He is not diaphoretic.  HENT:     Head: Normocephalic.  Eyes:     General: No scleral icterus.    Conjunctiva/sclera: Conjunctivae normal.  Cardiovascular:     Rate and Rhythm: Regular rhythm. Tachycardia present.     Pulses: Normal pulses.            Radial pulses are 2+ on the right side and 2+ on the left side.  Pulmonary:     Effort: Tachypnea present. No accessory muscle usage, prolonged expiration, respiratory distress or retractions.     Breath sounds: Decreased air movement present. No stridor. Decreased breath sounds present. No wheezing, rhonchi or rales.     Comments: Equal chest rise. Mild increased work of breathing. Abdominal:     General: There is no distension.     Palpations: Abdomen is soft.     Tenderness: There is generalized abdominal tenderness. There is no right CVA tenderness, left CVA tenderness, guarding or rebound.     Comments: nonfocal abd tenderness without guarding or rebound.  Musculoskeletal:     Cervical back: Normal range of motion.     Comments: Moves all extremities equally and without difficulty.  Skin:    General: Skin is warm and dry.     Capillary Refill: Capillary refill takes less than 2 seconds.     Comments: Hot to touch  Neurological:     Mental Status: He is alert.     GCS: GCS eye subscore is 4. GCS verbal subscore is 5. GCS motor subscore is 6.     Comments: Speech is clear and goal oriented.  Psychiatric:        Mood and Affect: Mood normal.     ED Results / Procedures / Treatments   Labs (all labs ordered are listed, but only abnormal results are displayed) Labs Reviewed  COMPREHENSIVE METABOLIC PANEL - Abnormal; Notable for the following components:      Result Value   Sodium 133 (*)    Calcium 8.5 (*)    AST 721 (*)    ALT 256 (*)    All other components within normal limits  CBC WITH DIFFERENTIAL/PLATELET - Abnormal; Notable for the following components:   WBC 13.2 (*)    Lymphs Abs 7.7 (*)    Basophils Absolute 0.2 (*)    Abs Immature Granulocytes 0.09 (*)    All other components within normal limits  URINALYSIS, ROUTINE W REFLEX MICROSCOPIC - Abnormal; Notable for the following components:   Color, Urine AMBER (*)    APPearance CLOUDY (*)    Hgb urine  dipstick LARGE (*)    Protein, ur 100 (*)    Bacteria, UA MANY (*)    All other components within normal limits  CK - Abnormal; Notable for the following components:   Total CK >50,000 (*)    All other components within normal limits  RESPIRATORY PANEL BY RT PCR (FLU A&B, COVID)  CULTURE, BLOOD (ROUTINE X 2)  CULTURE, BLOOD (ROUTINE X 2)  URINE CULTURE  LACTIC ACID, PLASMA  APTT  PROTIME-INR  LACTIC ACID, PLASMA  HEPATITIS PANEL, ACUTE  POC SARS CORONAVIRUS 2 AG -  ED    EKG EKG Interpretation  Date/Time:  Wednesday May 12 2019 22:14:55 EDT Ventricular Rate:  96 PR Interval:    QRS Duration: 105 QT Interval:  332 QTC Calculation: 420 R Axis:  23 Text Interpretation: Sinus rhythm RSR' in V1 or V2, right VCD or RVH ST elevation suggests acute pericarditis Confirmed by Milton Ferguson [phone]) on 05/13/2019 12:49:07 AM   Radiology DG Chest Port 1 View  Result Date: 05/12/2019 CLINICAL DATA:  Fever and body aches. EXAM: PORTABLE CHEST 1 VIEW COMPARISON:  01/22/2018 FINDINGS: Multiple overlying EKG wires.The cardiomediastinal contours are normal. The lungs are clear. Pulmonary vasculature is normal. No consolidation, pleural effusion, or pneumothorax. No acute osseous abnormalities are seen. IMPRESSION: No acute chest findings. Electronically Signed   By: Keith Rake M.D.   On: 05/12/2019 22:32    Procedures .Critical Care Performed by: Abigail Butts, PA-C Authorized by: Abigail Butts, PA-C   Critical care provider statement:    Critical care time (minutes):  45   Critical care time was exclusive of:  Teaching time and separately billable procedures and treating other patients   Critical care was necessary to treat or prevent imminent or life-threatening deterioration of the following conditions:  Renal failure, circulatory failure and sepsis   Critical care was time spent personally by me on the following activities:  Discussions with consultants,  evaluation of patient's response to treatment, examination of patient, ordering and performing treatments and interventions, ordering and review of laboratory studies, ordering and review of radiographic studies, pulse oximetry, re-evaluation of patient's condition, obtaining history from patient or surrogate and review of old charts   I assumed direction of critical care for this patient from another provider in my specialty: no     (including critical care time)  Medications Ordered in ED Medications  acetaminophen (TYLENOL) tablet 650 mg (650 mg Oral Given 05/12/19 2223)  sodium chloride 0.9 % bolus 1,000 mL (0 mLs Intravenous Stopped 05/13/19 0028)  ceFEPIme (MAXIPIME) 2 g in sodium chloride 0.9 % 100 mL IVPB (0 g Intravenous Stopped 05/13/19 0109)  metroNIDAZOLE (FLAGYL) IVPB 500 mg (0 mg Intravenous Stopped 05/13/19 0225)  vancomycin (VANCOREADY) IVPB 2000 mg/400 mL (0 mg Intravenous Stopped 05/13/19 0241)  sodium chloride 0.9 % bolus 1,000 mL (1,000 mLs Intravenous New Bag/Given 05/13/19 0223)    ED Course  I have reviewed the triage vital signs and the nursing notes.  Pertinent labs & imaging results that were available during my care of the patient were reviewed by me and considered in my medical decision making (see chart for details).  Clinical Course as of May 12 253  Wed May 12, 2019  2205 Febrile and tachycardic.  Temp(!): 101.6 F (38.7 C) [HM]  2321 Noted; code sepsis initiated.    WBC(!): 13.2 [HM]    Clinical Course User Index [HM] Ariauna Farabee, Gwenlyn Perking   MDM Rules/Calculators/A&P                       [facility] was evaluated in Emergency Department on 05/13/2019 for the symptoms described in the history of present illness. He was evaluated in the context of the global COVID-19 pandemic, which necessitated consideration that the patient might be at risk for infection with the SARS-CoV-2 virus that causes COVID-19. Institutional protocols and algorithms that  pertain to the evaluation of patients at risk for COVID-19 are in a state of rapid change based on information released by regulatory bodies including the CDC and federal and state organizations. These policies and algorithms were followed during the patient's care in the ED.  Patient presents with fever, tachycardia and elevated white blood cell count.  Code sepsis initiated with broad-spectrum antibiotics and  multiple fluid boluses.  Suspect possible coronavirus however given severe myalgias and reports of tea colored urine also concern for possible rhabdomyolysis.  Labs show CK greater than 50,000, UA with large hemoglobin but no red blood cells.  Creatinine 1.1.  Patient's tachycardia has improved with fever control.  Fluids have been given.  Elevated AST/ALT with normal bilirubin.  On repeat exam patient generally sore but without focal abdominal pain.  2:53 AM Influenza A, B and COVID-19 negative.  Still suspect viral myositis as potential source of patient's rhabdomyolysis.  Will need admission for fluids and monitoring.  PCP: none  Discussed patient's case with hospitalist, Toniann Fail.  I have recommended admission and patient (and family if present) agree with this plan. Admitting physician will place admission orders.   Final Clinical Impression(s) / ED Diagnoses Final diagnoses:  Fever, unspecified fever cause  Non-traumatic rhabdomyolysis  Acute hepatitis    Rx / DC Orders ED Discharge Orders    None       Mardene Sayer Boyd Kerbs 05/13/19 0303    Bethann Berkshire, MD 05/16/19 [id number]

## 2019-05-12 NOTE — ED Notes (Signed)
X-ray at bedside

## 2019-05-12 NOTE — ED Triage Notes (Signed)
Pt reports generalized body aches for the last 2 weeks. Also reports that his urine is tea-colored. 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 Denies known COVID exposure. Pt is febrile in triage.

## 2019-05-12 NOTE — Progress Notes (Signed)
A consult was received from an ED physician for Vancomycin, cefepime per pharmacy dosing.  The patient's profile has been reviewed for ht/wt/allergies/indication/available labs.   A one time order has been placed for Vancomycin 2gm iv x1, and Cefepime 2gm iv x1.  Further antibiotics/pharmacy consults should be ordered by admitting physician if indicated.                       Thank you, Aleene Davidson Crowford 05/12/2019  11:55 PM

## 2019-05-13 ENCOUNTER — Inpatient Hospital Stay (HOSPITAL_COMMUNITY): Payer: Self-pay

## 2019-05-13 ENCOUNTER — Encounter (HOSPITAL_COMMUNITY): Payer: Self-pay | Admitting: Internal Medicine

## 2019-05-13 DIAGNOSIS — R509 Fever, unspecified: Secondary | ICD-10-CM

## 2019-05-13 DIAGNOSIS — R9431 Abnormal electrocardiogram [ECG] [EKG]: Secondary | ICD-10-CM

## 2019-05-13 DIAGNOSIS — M6282 Rhabdomyolysis: Principal | ICD-10-CM

## 2019-05-13 LAB — RESPIRATORY PANEL BY RT PCR (FLU A&B, COVID)
Influenza A by PCR: NEGATIVE
Influenza B by PCR: NEGATIVE
SARS Coronavirus 2 by RT PCR: NEGATIVE

## 2019-05-13 LAB — CBC WITH DIFFERENTIAL/PLATELET
Abs Immature Granulocytes: 0.09 10*3/uL — ABNORMAL HIGH (ref 0.00–0.07)
Abs Immature Granulocytes: 0.09 10*3/uL — ABNORMAL HIGH (ref 0.00–0.07)
Basophils Absolute: 0.2 10*3/uL — ABNORMAL HIGH (ref 0.0–0.1)
Basophils Absolute: 0.2 10*3/uL — ABNORMAL HIGH (ref 0.0–0.1)
Basophils Relative: 1 %
Basophils Relative: 2 %
Eosinophils Absolute: 0.1 10*3/uL (ref 0.0–0.5)
Eosinophils Absolute: 0.2 10*3/uL (ref 0.0–0.5)
Eosinophils Relative: 1 %
Eosinophils Relative: 2 %
HCT: 46.2 % (ref 39.0–52.0)
HCT: 42 % (ref 39.0–52.0)
Hemoglobin: 15.2 g/dL (ref 13.0–17.0)
Hemoglobin: 13.5 g/dL (ref 13.0–17.0)
Immature Granulocytes: 1 %
Immature Granulocytes: 1 %
Lymphocytes Relative: 58 %
Lymphocytes Relative: 54 %
Lymphs Abs: 7.7 10*3/uL — ABNORMAL HIGH (ref 0.7–4.0)
Lymphs Abs: 6.7 10*3/uL — ABNORMAL HIGH (ref 0.7–4.0)
MCH: 27.8 pg (ref 26.0–34.0)
MCH: 27.2 pg (ref 26.0–34.0)
MCHC: 32.9 g/dL (ref 30.0–36.0)
MCHC: 32.1 g/dL (ref 30.0–36.0)
MCV: 84.6 fL (ref 80.0–100.0)
MCV: 84.7 fL (ref 80.0–100.0)
Monocytes Absolute: 0.8 10*3/uL (ref 0.1–1.0)
Monocytes Absolute: 0.8 10*3/uL (ref 0.1–1.0)
Monocytes Relative: 6 %
Monocytes Relative: 7 %
Neutro Abs: 4.4 10*3/uL (ref 1.7–7.7)
Neutro Abs: 4.1 10*3/uL (ref 1.7–7.7)
Neutrophils Relative %: 33 %
Neutrophils Relative %: 34 %
Platelets: 374 10*3/uL (ref 150–400)
Platelets: 297 10*3/uL (ref 150–400)
RBC: 5.46 MIL/uL (ref 4.22–5.81)
RBC: 4.96 MIL/uL (ref 4.22–5.81)
RDW: 15.4 % (ref 11.5–15.5)
RDW: 15.2 % (ref 11.5–15.5)
WBC: 13.2 10*3/uL — ABNORMAL HIGH (ref 4.0–10.5)
WBC: 12.1 10*3/uL — ABNORMAL HIGH (ref 4.0–10.5)
nRBC: 0 % (ref 0.0–0.2)
nRBC: 0 % (ref 0.0–0.2)

## 2019-05-13 LAB — HEPATITIS PANEL, ACUTE
HCV Ab: NONREACTIVE
Hep A IgM: NONREACTIVE
Hep B C IgM: NONREACTIVE
Hepatitis B Surface Ag: NONREACTIVE

## 2019-05-13 LAB — URINALYSIS, ROUTINE W REFLEX MICROSCOPIC
Bilirubin Urine: NEGATIVE
Glucose, UA: NEGATIVE mg/dL
Ketones, ur: NEGATIVE mg/dL
Leukocytes,Ua: NEGATIVE
Nitrite: NEGATIVE
Protein, ur: 100 mg/dL — AB
Specific Gravity, Urine: 1.019 (ref 1.005–1.030)
pH: 5 (ref 5.0–8.0)

## 2019-05-13 LAB — HIV ANTIBODY (ROUTINE TESTING W REFLEX): HIV Screen 4th Generation wRfx: NONREACTIVE

## 2019-05-13 LAB — TSH: TSH: 1.885 u[IU]/mL (ref 0.350–4.500)

## 2019-05-13 LAB — BASIC METABOLIC PANEL
Anion gap: 7 (ref 5–15)
BUN: 12 mg/dL (ref 6–20)
CO2: 24 mmol/L (ref 22–32)
Calcium: 8 mg/dL — ABNORMAL LOW (ref 8.9–10.3)
Chloride: 105 mmol/L (ref 98–111)
Creatinine, Ser: 1.1 mg/dL (ref 0.61–1.24)
GFR calc Af Amer: >60 mL/min (ref >60)
GFR calc non Af Amer: >60 mL/min (ref >60)
Glucose, Bld: 90 mg/dL (ref 70–99)
Potassium: 4.3 mmol/L (ref 3.5–5.1)
Sodium: 136 mmol/L (ref 135–145)

## 2019-05-13 LAB — HEPATIC FUNCTION PANEL
ALT: 214 U/L — ABNORMAL HIGH (ref 0–44)
AST: 568 U/L — ABNORMAL HIGH (ref 15–41)
Albumin: 2.9 g/dL — ABNORMAL LOW (ref 3.5–5.0)
Alkaline Phosphatase: 53 U/L (ref 38–126)
Bilirubin, Direct: 0.2 mg/dL (ref 0.0–0.2)
Indirect Bilirubin: 0.7 mg/dL (ref 0.3–0.9)
Total Bilirubin: 0.9 mg/dL (ref 0.3–1.2)
Total Protein: 6.2 g/dL — ABNORMAL LOW (ref 6.5–8.1)

## 2019-05-13 LAB — ECHOCARDIOGRAM COMPLETE
Height: 75 in
Weight: 4032 oz

## 2019-05-13 LAB — RAPID URINE DRUG SCREEN, HOSP PERFORMED
Amphetamines: NOT DETECTED
Barbiturates: NOT DETECTED
Benzodiazepines: NOT DETECTED
Cocaine: NOT DETECTED
Opiates: NOT DETECTED
Tetrahydrocannabinol: POSITIVE — AB

## 2019-05-13 LAB — PHOSPHORUS: Phosphorus: 4.3 mg/dL (ref 2.5–4.6)

## 2019-05-13 LAB — CK: Total CK: >50000 U/L — ABNORMAL HIGH (ref 49–397)

## 2019-05-13 LAB — MAGNESIUM: Magnesium: 2.1 mg/dL (ref 1.7–2.4)

## 2019-05-13 LAB — PROCALCITONIN: Procalcitonin: 0.2 ng/mL

## 2019-05-13 LAB — ACETAMINOPHEN LEVEL: Acetaminophen (Tylenol), Serum: <10 ug/mL — ABNORMAL LOW (ref 10–30)

## 2019-05-13 LAB — LACTIC ACID, PLASMA: Lactic Acid, Venous: 0.9 mmol/L (ref 0.5–1.9)

## 2019-05-13 LAB — PATHOLOGIST SMEAR REVIEW

## 2019-05-13 MED ORDER — DIPHENHYDRAMINE HCL 25 MG PO CAPS
25.0000 mg | ORAL_CAPSULE | Freq: Once | ORAL | Status: AC
  Administered 2019-05-13: 25 mg via ORAL
  Filled 2019-05-13: qty 1

## 2019-05-13 MED ORDER — TRAMADOL HCL 50 MG PO TABS
50.0000 mg | ORAL_TABLET | Freq: Four times a day (QID) | ORAL | Status: DC | PRN
  Administered 2019-05-13: 50 mg via ORAL
  Filled 2019-05-13: qty 1

## 2019-05-13 MED ORDER — HEPARIN SODIUM (PORCINE) 5000 UNIT/ML IJ SOLN
5000.0000 [IU] | Freq: Three times a day (TID) | INTRAMUSCULAR | Status: DC
  Administered 2019-05-13 – 2019-05-14 (×4): 5000 [IU] via SUBCUTANEOUS
  Filled 2019-05-13 (×3): qty 1

## 2019-05-13 MED ORDER — ALBUTEROL SULFATE HFA 108 (90 BASE) MCG/ACT IN AERS: 2.0000 | INHALATION_SPRAY | Freq: Four times a day (QID) | RESPIRATORY_TRACT | Status: DC

## 2019-05-13 MED ORDER — VANCOMYCIN HCL 1500 MG/300ML IV SOLN
1500.0000 mg | Freq: Two times a day (BID) | INTRAVENOUS | Status: DC
  Administered 2019-05-13 – 2019-05-14 (×2): 1500 mg via INTRAVENOUS
  Filled 2019-05-13 (×4): qty 300

## 2019-05-13 MED ORDER — ALBUTEROL SULFATE (2.5 MG/3ML) 0.083% IN NEBU: 2.5000 mg | INHALATION_SOLUTION | RESPIRATORY_TRACT | Status: DC | PRN

## 2019-05-13 MED ORDER — SODIUM CHLORIDE 0.9 % IV SOLN
2.0000 g | Freq: Three times a day (TID) | INTRAVENOUS | Status: DC
  Administered 2019-05-13 – 2019-05-14 (×4): 2 g via INTRAVENOUS
  Filled 2019-05-13 (×7): qty 2

## 2019-05-13 MED ORDER — ALBUTEROL SULFATE (2.5 MG/3ML) 0.083% IN NEBU
2.5000 mg | INHALATION_SOLUTION | Freq: Four times a day (QID) | RESPIRATORY_TRACT | Status: DC
  Administered 2019-05-13: 2.5 mg via RESPIRATORY_TRACT
  Filled 2019-05-13: qty 3

## 2019-05-13 MED ORDER — ONDANSETRON HCL 4 MG PO TABS: 4.0000 mg | ORAL_TABLET | Freq: Four times a day (QID) | ORAL | Status: DC | PRN

## 2019-05-13 MED ORDER — LACTATED RINGERS IV SOLN
INTRAVENOUS | Status: AC
  Administered 2019-05-13: 150 mL via INTRAVENOUS

## 2019-05-13 MED ORDER — SODIUM CHLORIDE 0.9 % IV BOLUS
1000.0000 mL | Freq: Once | INTRAVENOUS | Status: AC
  Administered 2019-05-13: 1000 mL via INTRAVENOUS

## 2019-05-13 MED ORDER — ONDANSETRON HCL 4 MG/2ML IJ SOLN: 4.0000 mg | Freq: Four times a day (QID) | INTRAMUSCULAR | Status: DC | PRN

## 2019-05-13 NOTE — Progress Notes (Signed)
  Echocardiogram 2D Echocardiogram has been performed.  Janalyn Harder 05/13/2019, 4:41 PM

## 2019-05-13 NOTE — Progress Notes (Signed)
Pharmacy Antibiotic Note  [facility] is a 34 y.o. male admitted on 05/12/2019 with sepsis.  Pharmacy has been consulted for vancomycin and cefepime dosing.  Plan:  Cefepime 2 gr IV q8h    Vancomycin 2000 mg IV x1, then 1500 mg IV q12h   Monitor clinical course, renal function, cultures as available   Height: 6\' 3"  (190.5 cm) Weight: 114.3 kg (252 lb) IBW/kg (Calculated) : 84.5  Temp (24hrs), Avg:99.5 F (37.5 C), Min:98.2 F (36.8 C), Max:101.6 F (38.7 C)  Recent Labs  Lab 05/12/19 2245 05/13/19 0327 05/13/19 0604  WBC 13.2*  --  12.1*  CREATININE 1.11  --  1.10  LATICACIDVEN 1.9 0.9  --     Estimated Creatinine Clearance: 131.5 mL/min (by C-G formula based on SCr of 1.1 mg/dL).    Allergies  Allergen Reactions  . Lentil Hives and Nausea And Vomiting    Antimicrobials this admission: 4/22 metronidazole x1  4/22 cefepime >>  4/22 vancomycin >>   Dose adjustments this admission:   Microbiology results: 4/22 BCx:  4/22 UCx:   4/22 COVID-19: Negative  4/22 Hepatitis Panel    Thank you for allowing pharmacy to be a part of this patient's care.   5/22, PharmD, BCPS 05/13/2019 7:20 AM

## 2019-05-13 NOTE — H&P (Signed)
History and Physical    TAKEO HARTS KYH:[id number] DOB: 09-21-1985 DOA: 05/12/2019  PCP: Patient, No Pcp Per  Patient coming from: Home.  Chief Complaint: Generalized body ache.  HPI: [facility] is a 34 y.o. male with no significant past medical history has been experiencing generalized body ache over the last 3 weeks.  Denies any nausea vomiting or diarrhea.  Denies chest pain or shortness of breath.  Denies taking any new medications or using any cocaine but does admit to taking marijuana.  Also noticed some subjective feeling of fever chills and dark urine.  Denies take any Tylenol.  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  Has not had any previous history of rhabdomyolysis personally or no family history of rhabdomyolysis.  ED Course: In the ER patient was febrile with temperature 101.6 F.  Covid test negative.  Labs show WBC count of 13.2 lactic acid normal complete metabolic panel shows normal creatinine AST was 721 ALT 256 CK level more than 50,000 Covid test was negative UA shows large hemoglobin RBCs was negative.  Had blood cultures urine cultures obtained started on empiric antibiotics and IV fluids for rhabdomyolysis with fever.  Review of Systems: As per HPI, rest all negative.   Past Medical History:  Diagnosis Date  . Obesity     History reviewed. No pertinent surgical history.   reports that he has been smoking cigarettes. He has been smoking about 1.00 pack per day. He has never used smokeless tobacco. He reports current alcohol use. He reports current drug use. Drug: Marijuana.  Allergies  Allergen Reactions  . Lentil Hives and Nausea And Vomiting    Family History  Problem Relation Age of Onset  . Diabetes Mellitus II Neg Hx     Prior to Admission medications   Medication Sig Start Date End Date Taking? Authorizing Provider  cholecalciferol (VITAMIN D3) 25 MCG (1000 UNIT) tablet Take 1,000 Units by mouth daily.   Yes  [provider]  loratadine (CLARITIN) 10 MG tablet Take 10 mg by mouth daily.   Yes [provider]  pantoprazole (PROTONIX) 40 MG tablet Take 1 tablet (40 mg total) by mouth daily. Patient not taking: Reported on 11/26/2015 11/07/15   Cathren Laine, MD    Physical Exam: Constitutional: Moderately built and nourished. Vitals:   05/12/19 2156 05/13/19 0110 05/13/19 0230 05/13/19 0330  BP: (!) 145/89 124/83 118/75 123/83  Pulse: (!) 106 77 73 72  Resp: 18 (!) 22 (!) 21 16  Temp: (!) 101.6 F (38.7 C) 98.8 F (37.1 C)  98.2 F (36.8 C)  TempSrc: Oral Oral    SpO2: 95% 96% 96% 100%   Eyes: Anicteric no pallor. ENMT: No discharge from the ears eyes nose or mouth. Neck: No mass felt.  No neck rigidity. Respiratory: No rhonchi or crepitations. Cardiovascular: S1-S2 heard. Abdomen: Soft nontender bowel sounds present. Musculoskeletal: No edema. Skin: No rash. Neurologic: Alert awake oriented time place and person.  Moves all extremities. Psychiatric: Appears normal per normal affect.   Labs on Admission: I have personally reviewed following labs and imaging studies  CBC: Recent Labs  Lab 05/12/19 2245  WBC 13.2*  NEUTROABS 4.4  HGB 15.2  HCT 46.2  MCV 84.6  PLT 374   Basic Metabolic Panel: Recent Labs  Lab 05/12/19 2245  NA 133*  K 3.7  CL 99  CO2 24  GLUCOSE 89  BUN 12  CREATININE 1.11  CALCIUM 8.5*   GFR: CrCl cannot be calculated (Unknown ideal weight.). Liver Function Tests: Recent Labs  Lab 05/12/19 2245  AST 721*  ALT 256*  ALKPHOS 59  BILITOT 0.4  PROT 7.4  ALBUMIN 3.6   No results for input(s): LIPASE, AMYLASE in the last 168 hours. No results for input(s): AMMONIA in the last 168 hours. Coagulation Profile: Recent Labs  Lab 05/12/19 2245  INR 0.9   Cardiac Enzymes: Recent Labs  Lab 05/12/19 2245  CKTOTAL >50,000*   BNP (last 3 results) No results for input(s): PROBNP in the last 8760 hours. HbA1C: No results  for input(s): HGBA1C in the last 72 hours. CBG: No results for input(s): GLUCAP in the last 168 hours. Lipid Profile: No results for input(s): CHOL, HDL, LDLCALC, TRIG, CHOLHDL, LDLDIRECT in the last 72 hours. Thyroid Function Tests: No results for input(s): TSH, T4TOTAL, FREET4, T3FREE, THYROIDAB in the last 72 hours. Anemia Panel: No results for input(s): VITAMINB12, FOLATE, FERRITIN, TIBC, IRON, RETICCTPCT in the last 72 hours. Urine analysis:    Component Value Date/Time   COLORURINE AMBER (A) 05/13/2019 0034   APPEARANCEUR CLOUDY (A) 05/13/2019 0034   LABSPEC 1.019 05/13/2019 0034   PHURINE 5.0 05/13/2019 0034   GLUCOSEU NEGATIVE 05/13/2019 0034   HGBUR LARGE (A) 05/13/2019 0034   BILIRUBINUR NEGATIVE 05/13/2019 0034   KETONESUR NEGATIVE 05/13/2019 0034   PROTEINUR 100 (A) 05/13/2019 0034   NITRITE NEGATIVE 05/13/2019 0034   LEUKOCYTESUR NEGATIVE 05/13/2019 0034   Sepsis Labs: @LABRCNTIP (procalcitonin:4,lacticidven:4) ) Recent Results (from the past 240 hour(s))  Respiratory Panel by RT PCR (Flu A&B, Covid) -     Status: None   Collection Time: 05/13/19  1:13 AM  Result Value Ref Range Status   SARS Coronavirus 2 by RT PCR NEGATIVE NEGATIVE Final    Comment: (NOTE) SARS-CoV-2 target nucleic acids are NOT DETECTED. The SARS-CoV-2 RNA is generally detectable in upper respiratoy specimens during the acute phase of infection. The lowest concentration of SARS-CoV-2 viral copies this assay can detect is 131 copies/mL. A negative result does not preclude SARS-Cov-2 infection and should not be used as the sole basis for treatment or other patient management decisions. A negative result may occur with  improper specimen collection/handling, submission of specimen other than nasopharyngeal swab, presence of viral mutation(s) within the areas targeted by this assay, and inadequate number of viral copies (<131 copies/mL). A negative result must be combined with  clinical observations, patient history, and epidemiological information. The expected result is Negative. Fact Sheet for Patients:  PinkCheek.be Fact Sheet for Healthcare Providers:  GravelBags.it This test is not yet ap proved or cleared by the [place] FDA and  has been authorized for detection and/or diagnosis of SARS-CoV-2 by FDA under an Emergency Use Authorization (EUA). This EUA will remain  in effect (meaning this test can be used) for the duration of the COVID-19 declaration under Section 564(b)(1) of the Act, 21 U.S.C. section 360bbb-3(b)(1), unless the authorization is terminated or revoked sooner.    Influenza A by PCR NEGATIVE NEGATIVE Final   Influenza B by PCR NEGATIVE NEGATIVE Final    Comment: (NOTE) The Xpert Xpress SARS-CoV-2/FLU/RSV assay is intended as an aid in  the diagnosis of influenza from Nasopharyngeal swab specimens and  should not be used as a sole basis for treatment. Nasal washings and  aspirates are unacceptable for Xpert Xpress SARS-CoV-2/FLU/RSV  testing. Fact Sheet for Patients: PinkCheek.be Fact Sheet for Healthcare Providers: GravelBags.it This test is not yet approved  or cleared by the [place] and  has been authorized for detection and/or diagnosis of SARS-CoV-2 by  FDA under an Emergency Use Authorization (EUA). This EUA will remain  in effect (meaning this test can be used) for the duration of the  Covid-19 declaration under Section 564(b)(1) of the Act, 21  U.S.C. section 360bbb-3(b)(1), unless the authorization is  terminated or revoked. Performed at [facility], [address] [place], [place] [address]      Radiological Exams on Admission: DG Chest Port 1 View  Result Date: 05/12/2019 CLINICAL DATA:  Fever and body aches. EXAM: PORTABLE CHEST 1 VIEW COMPARISON:  01/22/2018 FINDINGS:  Multiple overlying EKG wires.The cardiomediastinal contours are normal. The lungs are clear. Pulmonary vasculature is normal. No consolidation, pleural effusion, or pneumothorax. No acute osseous abnormalities are seen. IMPRESSION: No acute chest findings. Electronically Signed   By: Narda Rutherford M.D.   On: 05/12/2019 22:32    EKG: Independently reviewed.  Normal sinus rhythm with nonspecific T changes.  Assessment/Plan Principal Problem:   Rhabdomyolysis    1. Nontraumatic rhabdomyolysis cause not clear.  We will continue hydration follow metabolic panel intake output.  May check carnitine level with next blood draw. 2. Elevated LFTs likely from rhabdomyolysis.  Since patient had fever and acute hepatitis panel has been sent.  Check Tylenol level.  Follow LFTs. 3. Fever for which patient was empirically started on antibiotics follow cultures.  Procalcitonin is negative may discontinue antibiotics.  Since patient has significantly elevated CK levels will need close monitoring for any further deterioration and will need inpatient status.   DVT prophylaxis: Heparin. Code Status: Full code. Family Communication: Discussed with patient. Disposition Plan: Home. Consults called: None. Admission status: Inpatient.   Eduard Clos MD [facility] Pager [phone].  If 7PM-7AM, please contact night-coverage [email or web address] Password [facility]  05/13/2019, 5:01 AM

## 2019-05-13 NOTE — Progress Notes (Signed)
Patient arrives to room 1514 at this time via stretcher from ED.  Patient independent to bed from stretcher.  Patient oriented to use of callbell and is in the bed with siderails up x3 and bed low.

## 2019-05-13 NOTE — ED Notes (Signed)
Pharmacy notified need for medication verification

## 2019-05-13 NOTE — ED Notes (Signed)
Pt provided with water. Pt denies any other needs or complaints at this time.

## 2019-05-13 NOTE — Progress Notes (Signed)
34 yo male with history of childhood asthma admitted with fever generalized body aches and pains myalgias, postnasal drip sinus pressure and shortness of breath which is exacerbated with cough. He has been started on empiric antibiotics Vanco cefepime and Flagyl. He is also found to have severe rhabdo with CPK above 50,000. Procalcitonin and lactic acid are normal.   Check echocardiogram Add albuterol inhaler Will narrow antibiotics to Augmentin for possible sinusitis Possible viral syndrome causing rhabdo Urine drug screen positive for THC Continue IV fluids follow-up labs in a.m.  LFTs improving.

## 2019-05-14 DIAGNOSIS — B179 Acute viral hepatitis, unspecified: Secondary | ICD-10-CM

## 2019-05-14 LAB — COMPREHENSIVE METABOLIC PANEL
ALT: 210 U/L — ABNORMAL HIGH (ref 0–44)
AST: 463 U/L — ABNORMAL HIGH (ref 15–41)
Albumin: 2.9 g/dL — ABNORMAL LOW (ref 3.5–5.0)
Alkaline Phosphatase: 54 U/L (ref 38–126)
Anion gap: 9 (ref 5–15)
BUN: 10 mg/dL (ref 6–20)
CO2: 23 mmol/L (ref 22–32)
Calcium: 8.4 mg/dL — ABNORMAL LOW (ref 8.9–10.3)
Chloride: 105 mmol/L (ref 98–111)
Creatinine, Ser: 1.29 mg/dL — ABNORMAL HIGH (ref 0.61–1.24)
GFR calc Af Amer: >60 mL/min (ref >60)
GFR calc non Af Amer: >60 mL/min (ref >60)
Glucose, Bld: 103 mg/dL — ABNORMAL HIGH (ref 70–99)
Potassium: 4.5 mmol/L (ref 3.5–5.1)
Sodium: 137 mmol/L (ref 135–145)
Total Bilirubin: 0.6 mg/dL (ref 0.3–1.2)
Total Protein: 6.1 g/dL — ABNORMAL LOW (ref 6.5–8.1)

## 2019-05-14 LAB — CBC
HCT: 41.9 % (ref 39.0–52.0)
Hemoglobin: 13.6 g/dL (ref 13.0–17.0)
MCH: 27.5 pg (ref 26.0–34.0)
MCHC: 32.5 g/dL (ref 30.0–36.0)
MCV: 84.6 fL (ref 80.0–100.0)
Platelets: 316 10*3/uL (ref 150–400)
RBC: 4.95 MIL/uL (ref 4.22–5.81)
RDW: 15.1 % (ref 11.5–15.5)
WBC: 13.5 10*3/uL — ABNORMAL HIGH (ref 4.0–10.5)
nRBC: 0 % (ref 0.0–0.2)

## 2019-05-14 LAB — URINE CULTURE: Culture: NO GROWTH

## 2019-05-14 LAB — CK: Total CK: >50000 U/L — ABNORMAL HIGH (ref 49–397)

## 2019-05-14 MED ORDER — LACTATED RINGERS IV BOLUS
1000.0000 mL | Freq: Once | INTRAVENOUS | Status: AC
  Administered 2019-05-14: 1000 mL via INTRAVENOUS

## 2019-05-14 MED ORDER — LACTATED RINGERS IV SOLN: INTRAVENOUS | Status: DC

## 2019-05-14 NOTE — Progress Notes (Signed)
PROGRESS NOTE    Philip Bautista  QIW:[id number] DOB: 1985-12-15 DOA: 05/12/2019 PCP: Patient, No Pcp Per    Brief Narrative: Philip Bautista is a 34 y.o. male with no significant past medical history has been experiencing generalized body ache over the last 3 weeks.  Denies any nausea vomiting or diarrhea.  Denies chest pain or shortness of breath.  Denies taking any new medications or using any cocaine but does admit to taking marijuana.  Also noticed some subjective feeling of fever chills and dark urine.  Denies take any Tylenol.  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  Has not had any previous history of rhabdomyolysis personally or no family history of rhabdomyolysis.  ED Course: In the ER patient was febrile with temperature 101.6 F.  Covid test negative.  Labs show WBC count of 13.2 lactic acid normal complete metabolic panel shows normal creatinine AST was 721 ALT 256 CK level more than 50,000 Covid test was negative UA shows large hemoglobin RBCs was negative.  Had blood cultures urine cultures obtained started on empiric antibiotics and IV fluids for rhabdomyolysis with fever.  Assessment & Plan:   Principal Problem:   Rhabdomyolysis  #1 rhabdomyolysis likely secondary to viral syndrome.  Needs to continue IV fluids.  CPK still above 50,000.  Creatinine elevated.  Urine clearing up. TSH 1.8.  #2 elevated LFTs secondary to rhabdo.  Hepatitis panel negative.  LFTs slowly trending down.  Acetaminophen level less than 10. #3 fever culture so far negative.  Question related to viral syndrome.  On cefepime empirically.  Vanco stopped.  #4 history of asthma continue the inhaler. Echocardiogram normal ejection fraction.  #5 acute sinusitis continue antibiotics follow-up cultures.  Estimated body mass index is 33.37 kg/m as calculated from the following:   Height as of this encounter: 6\' 3"  (1.905 m).   Weight as of this encounter: 121.1  kg.  DVT prophylaxis: Lovenox  code Status: Full code Family Communication: Discussed with patient disposition Plan: Patient came from home plan is for discharge him home. Not ready for discharge due to rhabdomyolysis.  CPK is still above 50,000. Likely will need another 2 to 3 days in hospital on IV fluids.  Consultants:   None  Procedures: None  antimicrobials: Cefepime  Subjective: Patient resting in bed feels better than yesterday no new complaints  Objective: Vitals:   05/13/19 1648 05/13/19 1801 05/13/19 2111 05/14/19 0547  BP:  (!) 145/92 (!) 145/71 134/86  Pulse:  87 80 90  Resp:  20 19 18   Temp:  100.3 F (37.9 C) 99.4 F (37.4 C) 99.5 F (37.5 C)  TempSrc:  Oral Oral Oral  SpO2: 97% 100% 97% 99%  Weight:  124.5 kg  121.1 kg  Height:  6\' 3"  (1.905 m)      Intake/Output Summary (Last 24 hours) at 05/14/2019 1104 Last data filed at 05/14/2019 1017 Gross per 24 hour  Intake 4091.47 ml  Output 4710 ml  Net -618.53 ml   Filed Weights   05/13/19 0630 05/13/19 1801 05/14/19 0547  Weight: 114.3 kg 124.5 kg 121.1 kg    Examination:  General exam: Appears calm and comfortable  Respiratory system: few scattered wheezing to auscultation. Respiratory effort normal. Cardiovascular system: S1 & S2 heard, RRR. No JVD, murmurs, rubs, gallops or clicks. No pedal edema. Gastrointestinal system: Abdomen is nondistended, soft and nontender. No organomegaly or masses felt. Normal bowel sounds heard. Central nervous system: Alert  and oriented. No focal neurological deficits. Extremities: Symmetric 5 x 5 power. Skin: No rashes, lesions or ulcers Psychiatry: Judgement and insight appear normal. Mood & affect appropriate.     Data Reviewed: I have personally reviewed following labs and imaging studies  CBC: Recent Labs  Lab 05/12/19 2245 05/13/19 0604 05/14/19 0506  WBC 13.2* 12.1* 13.5*  NEUTROABS 4.4 4.1  --   HGB 15.2 13.5 13.6  HCT 46.2 42.0 41.9  MCV 84.6 84.7  84.6  PLT 374 297 316   Basic Metabolic Panel: Recent Labs  Lab 05/12/19 2245 05/13/19 0604 05/14/19 0506  NA 133* 136 137  K 3.7 4.3 4.5  CL 99 105 105  CO2 24 24 23   GLUCOSE 89 90 103*  BUN 12 12 10   CREATININE 1.11 1.10 1.29*  CALCIUM 8.5* 8.0* 8.4*  MG  --  2.1  --   PHOS  --  4.3  --    GFR: Estimated Creatinine Clearance: 115.2 mL/min (A) (by C-G formula based on SCr of 1.29 mg/dL (H)). Liver Function Tests: Recent Labs  Lab 05/12/19 2245 05/13/19 0604 05/14/19 0506  AST 721* 568* 463*  ALT 256* 214* 210*  ALKPHOS 59 53 54  BILITOT 0.4 0.9 0.6  PROT 7.4 6.2* 6.1*  ALBUMIN 3.6 2.9* 2.9*   No results for input(s): LIPASE, AMYLASE in the last 168 hours. No results for input(s): AMMONIA in the last 168 hours. Coagulation Profile: Recent Labs  Lab 05/12/19 2245  INR 0.9   Cardiac Enzymes: Recent Labs  Lab 05/12/19 2245 05/14/19 0506  CKTOTAL >50,000* >50,000*   BNP (last 3 results) No results for input(s): PROBNP in the last 8760 hours. HbA1C: No results for input(s): HGBA1C in the last 72 hours. CBG: No results for input(s): GLUCAP in the last 168 hours. Lipid Profile: No results for input(s): CHOL, HDL, LDLCALC, TRIG, CHOLHDL, LDLDIRECT in the last 72 hours. Thyroid Function Tests: Recent Labs    05/13/19 0604  TSH 1.885   Anemia Panel: No results for input(s): VITAMINB12, FOLATE, FERRITIN, TIBC, IRON, RETICCTPCT in the last 72 hours. Sepsis Labs: Recent Labs  Lab 05/12/19 2245 05/13/19 0327 05/13/19 0604  PROCALCITON  --   --  0.20  LATICACIDVEN 1.9 0.9  --     Recent Results (from the past 240 hour(s))  Blood Culture (routine x 2)     Status: None (Preliminary result)   Collection Time: 05/12/19 10:45 PM   Specimen: BLOOD LEFT WRIST  Result Value Ref Range Status   Specimen Description   Final    BLOOD LEFT WRIST Performed at [facility], [address], [place], [address] [place]    Special Requests   Final    BOTTLES  DRAWN AEROBIC AND ANAEROBIC Blood Culture adequate volume Performed at [facility], [address], [place], [place]    Culture PENDING  Incomplete   Report Status PENDING  Incomplete  Blood Culture (routine x 2)     Status: None (Preliminary result)   Collection Time: 05/12/19 10:54 PM   Specimen: BLOOD LEFT FOREARM  Result Value Ref Range Status   Specimen Description   Final    BLOOD LEFT FOREARM Performed at [facility], [address], [place], [address] [place]    Special Requests   Final    BOTTLES DRAWN AEROBIC AND ANAEROBIC Blood Culture results may not be optimal due to an excessive volume of blood received in culture bottles Performed at  [facility], [address] [place], [place] [address]    Culture PENDING  Incomplete   Report Status PENDING  Incomplete  Urine culture     Status: None   Collection Time: 05/13/19 12:34 AM   Specimen: In/Out Cath Urine  Result Value Ref Range Status   Specimen Description   Final    IN/OUT CATH URINE Performed at [facility], [address] [place], [place] [address]    Special Requests   Final    NONE Performed at [facility], [address] [place], [place] [address]    Culture   Final    NO GROWTH Performed at [facility], [address] [place], [place] [address]    Report Status 05/14/2019 FINAL  Final  Respiratory Panel by RT PCR (Flu A&B, Covid) -     Status: None   Collection Time: 05/13/19  1:13 AM  Result Value Ref Range Status   SARS Coronavirus 2 by RT PCR NEGATIVE NEGATIVE Final    Comment: (NOTE) SARS-CoV-2 target nucleic acids are NOT DETECTED. The SARS-CoV-2 RNA is generally detectable in upper respiratoy specimens during the acute phase of infection. The lowest concentration of SARS-CoV-2 viral copies this assay can detect is 131 copies/mL. A negative result does not preclude SARS-Cov-2 infection and  should not be used as the sole basis for treatment or other patient management decisions. A negative result may occur with  improper specimen collection/handling, submission of specimen other than nasopharyngeal swab, presence of viral mutation(s) within the areas targeted by this assay, and inadequate number of viral copies (<131 copies/mL). A negative result must be combined with clinical observations, patient history, and epidemiological information. The expected result is Negative. Fact Sheet for Patients:  https://www.moore.com/https://www.fda.gov/media/142436/download Fact Sheet for Healthcare Providers:  https://www.young.biz/https://www.fda.gov/media/142435/download This test is not yet ap proved or cleared by the [place] FDA and  has been authorized for detection and/or diagnosis of SARS-CoV-2 by FDA under an Emergency Use Authorization (EUA). This EUA will remain  in effect (meaning this test can be used) for the duration of the COVID-19 declaration under Section 564(b)(1) of the Act, 21 U.S.C. section 360bbb-3(b)(1), unless the authorization is terminated or revoked sooner.    Influenza A by PCR NEGATIVE NEGATIVE Final   Influenza B by PCR NEGATIVE NEGATIVE Final    Comment: (NOTE) The Xpert Xpress SARS-CoV-2/FLU/RSV assay is intended as an aid in  the diagnosis of influenza from Nasopharyngeal swab specimens and  should not be used as a sole basis for treatment. Nasal washings and  aspirates are unacceptable for Xpert Xpress SARS-CoV-2/FLU/RSV  testing. Fact Sheet for Patients: https://www.moore.com/https://www.fda.gov/media/142436/download Fact Sheet for Healthcare Providers: https://www.young.biz/https://www.fda.gov/media/142435/download This test is not yet approved or cleared by the [place] FDA and  has been authorized for detection and/or diagnosis of SARS-CoV-2 by  FDA under an Emergency Use Authorization (EUA). This EUA will remain  in effect (meaning this test can be used) for the duration of the  Covid-19 declaration under Section  564(b)(1) of the Act, 21  U.S.C. section 360bbb-3(b)(1), unless the authorization is  terminated or revoked. Performed at [facility], [address] [place], [place] [address]          [facility]  Result Date: 05/12/2019 CLINICAL DATA:  Fever and body aches. EXAM: PORTABLE CHEST 1 VIEW COMPARISON:  01/22/2018 FINDINGS: Multiple overlying EKG wires.The cardiomediastinal contours are normal. The lungs are clear.  Pulmonary vasculature is normal. No consolidation, pleural effusion, or pneumothorax. No acute osseous abnormalities are seen. IMPRESSION: No acute chest findings. Electronically Signed   By: Keith Rake M.D.   On: 05/12/2019 22:32   ECHOCARDIOGRAM COMPLETE  Result Date: 05/13/2019    ECHOCARDIOGRAM REPORT   Patient Name:   Philip Bautista Date of Exam: 05/13/2019 Medical Rec #:  [id number]         Height:       75.0 in Accession #:    [id number]        Weight:       252.0 lb Date of Birth:  [birth year]         BSA:          2.421 m Patient Age:    73 years          BP:           129/91 mmHg Patient Gender: M                 HR:           78 bpm. Exam Location:  Inpatient Procedure: 2D Echo, Cardiac Doppler, Color Doppler and Strain Analysis Indications:    R94.31 Abnormal EKG  History:        Patient has no prior history of Echocardiogram examinations.                 Signs/Symptoms:Dyspnea and Fever. Rhabdomyolysis.  Sonographer:    Roseanna Rainbow RDCS Referring Phys: [id number] [place]  1. Left ventricular ejection fraction, by estimation, is 55 to 60%. The left ventricle has normal function. The left ventricle has no regional wall motion abnormalities. There is mild left ventricular hypertrophy. Left ventricular diastolic parameters were normal. GLS -20.5%, normal.  2. Right ventricular systolic function is normal. The right ventricular size is normal. Tricuspid regurgitation signal is inadequate for assessing PA  pressure.  3. The mitral valve is normal in structure. No evidence of mitral valve regurgitation. No evidence of mitral stenosis.  4. The aortic valve is tricuspid. Aortic valve regurgitation is not visualized. No aortic stenosis is present.  5. Aortic dilatation noted. There is mild dilatation of the aortic root measuring 39 mm.  6. The inferior vena cava is normal in size with greater than 50% respiratory variability, suggesting right atrial pressure of 3 mmHg. FINDINGS  Left Ventricle: Left ventricular ejection fraction, by estimation, is 55 to 60%. The left ventricle has normal function. The left ventricle has no regional wall motion abnormalities. The left ventricular internal cavity size was normal in size. There is  mild left ventricular hypertrophy. Left ventricular diastolic parameters were normal. Right Ventricle: The right ventricular size is normal. No increase in right ventricular wall thickness. Right ventricular systolic function is normal. Tricuspid regurgitation signal is inadequate for assessing PA pressure. Left Atrium: Left atrial size was normal in size. Right Atrium: Right atrial size was normal in size. Pericardium: There is no evidence of pericardial effusion. Mitral Valve: The mitral valve is normal in structure. No evidence of mitral valve regurgitation. No evidence of mitral valve stenosis. Tricuspid Valve: The tricuspid valve is normal in structure. Tricuspid valve regurgitation is not demonstrated. Aortic Valve: The aortic valve is tricuspid. Aortic valve regurgitation is not visualized. No aortic stenosis is present. Pulmonic Valve: The pulmonic valve was normal in structure. Pulmonic valve regurgitation is not visualized. Aorta: Aortic dilatation noted. There is mild dilatation of the aortic root measuring 39 mm. Venous:  The inferior vena cava is normal in size with greater than 50% respiratory variability, suggesting right atrial pressure of 3 mmHg. IAS/Shunts: No atrial level shunt  detected by color flow Doppler.  LEFT VENTRICLE PLAX 2D LVIDd:         5.00 cm      Diastology LVIDs:         3.30 cm      LV e' lateral:   14.30 cm/s LV PW:         1.30 cm      LV E/e' lateral: 5.2 LV IVS:        1.20 cm      LV e' medial:    9.14 cm/s LVOT diam:     2.60 cm      LV E/e' medial:  8.1 LV SV:         108 LV SV Index:   45 LVOT Area:     5.31 cm  LV Volumes (MOD) LV vol d, MOD A2C: 169.0 ml LV vol d, MOD A4C: 116.0 ml LV vol s, MOD A2C: 75.4 ml LV vol s, MOD A4C: 43.7 ml LV SV MOD A2C:     93.6 ml LV SV MOD A4C:     116.0 ml LV SV MOD BP:      83.9 ml RIGHT VENTRICLE             IVC RV S prime:     14.90 cm/s  IVC diam: 2.00 cm TAPSE (M-mode): 2.6 cm LEFT ATRIUM             Index       RIGHT ATRIUM           Index LA diam:        3.90 cm 1.61 cm/m  RA Area:     13.80 cm LA Vol (A2C):   52.7 ml 21.77 ml/m RA Volume:   34.60 ml  14.29 ml/m LA Vol (A4C):   29.1 ml 12.02 ml/m LA Biplane Vol: 40.7 ml 16.81 ml/m  AORTIC VALVE LVOT Vmax:   104.00 cm/s LVOT Vmean:  76.300 cm/s LVOT VTI:    0.203 m  AORTA Ao Root diam: 3.90 cm Ao Asc diam:  3.60 cm MITRAL VALVE MV Area (PHT): 3.99 cm    SHUNTS MV Decel Time: 190 msec    Systemic VTI:  0.20 m MV E velocity: 73.70 cm/s  Systemic Diam: 2.60 cm MV A velocity: 47.20 cm/s MV E/A ratio:  1.56 Marca Ancona MD Electronically signed by Marca Ancona MD Signature Date/Time: 05/13/2019/5:44:04 PM    Final         Scheduled Meds: . heparin  5,000 Units Subcutaneous Q8H   Continuous Infusions: . ceFEPime (MAXIPIME) IV 2 g (05/14/19 0834)  . lactated ringers       LOS: 1 day     Alwyn Ren, MD 05/14/2019, 11:04 AM

## 2019-05-14 NOTE — Progress Notes (Signed)
Pharmacy Antibiotic Note  Philip Bautista is a 34 y.o. male admitted on 05/12/2019 with rhabdomyolysis. Pharmacy has been consulted for cefepime dosing.  Today, 05/14/19 -WBC 13.5 slightly elevated -SCr 1.29, slightly increased. CrCL > 80 mL/min -Lactate 1.9 --> 0.9  Patient started on cefepime + vancomycin on admission, currently on day #2 cefepime.   Plan:  Continue cefepime 2 g IV q8h  Follow culture data and renal function  Height: 6\' 3"  (190.5 cm) Weight: 121.1 kg (266 lb 15.6 oz) IBW/kg (Calculated) : 84.5  Temp (24hrs), Avg:99.8 F (37.7 C), Min:99.4 F (37.4 C), Max:100.3 F (37.9 C)  Recent Labs  Lab 05/12/19 2245 05/13/19 0327 05/13/19 0604 05/14/19 0506  WBC 13.2*  --  12.1* 13.5*  CREATININE 1.11  --  1.10 1.29*  LATICACIDVEN 1.9 0.9  --   --     Estimated Creatinine Clearance: 115.2 mL/min (A) (by C-G formula based on SCr of 1.29 mg/dL (H)).    Allergies  Allergen Reactions  . Lentil Hives and Nausea And Vomiting    Antimicrobials this admission: 4/22 metronidazole x1  4/22 cefepime >>  4/22 vancomycin >> 4/23  Dose adjustments this admission:  Microbiology results: 4/22 BCx: ngtd 4/22 UCx:  ngF 4/22 COVID-19: Negative   5/22, PharmD 05/14/19 10:22 AM

## 2019-05-14 NOTE — Progress Notes (Signed)
Pt is leaving AMA. Pt expressed to RN that he needs to leave because he is anxious about his car getting towed due to not being able to make his car payment. The pt was educated on the risks of his kidneys shutting down and possibility of receiving hemodialysis. The MD is aware. Pt signed the AMA paperwork and it was placed in his chart.

## 2019-05-18 LAB — CULTURE, BLOOD (ROUTINE X 2)
Culture: NO GROWTH
Culture: NO GROWTH
Special Requests: ADEQUATE

## 2019-05-18 NOTE — Discharge Summary (Signed)
Physician Discharge Summary  Philip Bautista IWL:[id number] DOB: 1985/12/15 DOA: 05/12/2019  PCP: Patient, No Pcp Per  Admit date: 05/12/2019 Discharge date: 05/18/2019  Admitted From: Home Disposition: Patient left AGAINST MEDICAL ADVICE    Brief/Interim Summary:Philip A Bennettis a 34 y.o.malewithno significant past medical history has been experiencing generalized body ache over the last 3 weeks. Denies any nausea vomiting or diarrhea. Denies chest pain or shortness of breath. Denies taking any new medications or using any cocaine but does admit to taking marijuana. Also noticed some subjective feeling of fever chills and dark urine. Denies take any Tylenol. 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 Has not had any previous history of rhabdomyolysis personally or no family history of rhabdomyolysis.  ED Course:In the ER patient was febrile with temperature 101.22F. Covid test negative. Labs show WBC count of 13.2 lactic acid normal complete metabolic panel shows normal creatinine AST was 721 ALT 256 CK level more than 50,000 Covid test was negative UA shows large hemoglobin RBCs was negative. Had blood cultures urine cultures obtained started on empiric antibiotics and IV fluids for rhabdomyolysis with fever.    Discharge Diagnoses:  Principal Problem:   Rhabdomyolysis Active Problems:   Acute hepatitis  #1 rhabdomyolysis likely secondary to viral syndrome.  Needs to continue IV fluids.  CPK still above 50,000.  Creatinine elevated.  Urine clearing up. TSH 1.8. Patient left AMA  #2 elevated LFTs secondary to rhabdo.  Hepatitis panel negative.  LFTs slowly trending down.  Acetaminophen level less than 10. #3 fever culture so far negative.  Question related to viral syndrome.  On cefepime empirically.  Vanco stopped.  #4 history of asthma continue the inhaler. Echocardiogram normal ejection fraction.  #5 acute sinusitis continue  antibiotics follow-up cultures.  Discharge Instructions   Allergies as of 05/14/2019      Reactions   Lentil Hives, Nausea And Vomiting      Medication List    ASK your doctor about these medications   cholecalciferol 25 MCG (1000 UNIT) tablet Commonly known as: VITAMIN D3 Take 1,000 Units by mouth daily.   loratadine 10 MG tablet Commonly known as: CLARITIN Take 10 mg by mouth daily.   pantoprazole 40 MG tablet Commonly known as: PROTONIX Take 1 tablet (40 mg total) by mouth daily.       Allergies  Allergen Reactions  . Lentil Hives and Nausea And Vomiting    Consultations: None  Procedures/Studies: DG Chest Port 1 View  Result Date: 05/12/2019 CLINICAL DATA:  Fever and body aches. EXAM: PORTABLE CHEST 1 VIEW COMPARISON:  01/22/2018 FINDINGS: Multiple overlying EKG wires.The cardiomediastinal contours are normal. The lungs are clear. Pulmonary vasculature is normal. No consolidation, pleural effusion, or pneumothorax. No acute osseous abnormalities are seen. IMPRESSION: No acute chest findings. Electronically Signed   By: Philip Bautista M.D.   On: 05/12/2019 22:32   ECHOCARDIOGRAM COMPLETE  Result Date: 05/13/2019    ECHOCARDIOGRAM REPORT   Patient Name:   Philip Bautista Date of Exam: 05/13/2019 Medical Rec #:  [id number]         Height:       75.0 in Accession #:    [id number]        Weight:       252.0 lb Date of Birth:  08/17/1985         BSA:          2.421 m Patient Age:    34 years  BP:           129/91 mmHg Patient Gender: M                 HR:           78 bpm. Exam Location:  Inpatient Procedure: 2D Echo, Cardiac Doppler, Color Doppler and Strain Analysis Indications:    R94.31 Abnormal EKG  History:        Patient has no prior history of Echocardiogram examinations.                 Signs/Symptoms:Dyspnea and Fever. Rhabdomyolysis.  Sonographer:    Philip Bautista RDCS Referring Phys: Philip Philip Bautista IMPRESSIONS  1. Left ventricular ejection  fraction, by estimation, is 55 to 60%. The left ventricle has normal function. The left ventricle has no regional wall motion abnormalities. There is mild left ventricular hypertrophy. Left ventricular diastolic parameters were normal. GLS -20.5%, normal.  2. Right ventricular systolic function is normal. The right ventricular size is normal. Tricuspid regurgitation signal is inadequate for assessing PA pressure.  3. The mitral valve is normal in structure. No evidence of mitral valve regurgitation. No evidence of mitral stenosis.  4. The aortic valve is tricuspid. Aortic valve regurgitation is not visualized. No aortic stenosis is present.  5. Aortic dilatation noted. There is mild dilatation of the aortic root measuring 39 mm.  6. The inferior vena cava is normal in size with greater than 50% respiratory variability, suggesting right atrial pressure of 3 mmHg. FINDINGS  Left Ventricle: Left ventricular ejection fraction, by estimation, is 55 to 60%. The left ventricle has normal function. The left ventricle has no regional wall motion abnormalities. The left ventricular internal cavity size was normal in size. There is  mild left ventricular hypertrophy. Left ventricular diastolic parameters were normal. Right Ventricle: The right ventricular size is normal. No increase in right ventricular wall thickness. Right ventricular systolic function is normal. Tricuspid regurgitation signal is inadequate for assessing PA pressure. Left Atrium: Left atrial size was normal in size. Right Atrium: Right atrial size was normal in size. Pericardium: There is no evidence of pericardial effusion. Mitral Valve: The mitral valve is normal in structure. No evidence of mitral valve regurgitation. No evidence of mitral valve stenosis. Tricuspid Valve: The tricuspid valve is normal in structure. Tricuspid valve regurgitation is not demonstrated. Aortic Valve: The aortic valve is tricuspid. Aortic valve regurgitation is not visualized.  No aortic stenosis is present. Pulmonic Valve: The pulmonic valve was normal in structure. Pulmonic valve regurgitation is not visualized. Aorta: Aortic dilatation noted. There is mild dilatation of the aortic root measuring 39 mm. Venous: The inferior vena cava is normal in size with greater than 50% respiratory variability, suggesting right atrial pressure of 3 mmHg. IAS/Shunts: No atrial level shunt detected by color flow Doppler.  LEFT VENTRICLE PLAX 2D LVIDd:         5.00 cm      Diastology LVIDs:         3.30 cm      LV e' lateral:   14.30 cm/s LV PW:         1.30 cm      LV E/e' lateral: 5.2 LV IVS:        1.20 cm      LV e' medial:    9.14 cm/s LVOT diam:     2.60 cm      LV E/e' medial:  8.1 LV SV:  108 LV SV Index:   45 LVOT Area:     5.31 cm  LV Volumes (MOD) LV vol d, MOD A2C: 169.0 ml LV vol d, MOD A4C: 116.0 ml LV vol s, MOD A2C: 75.4 ml LV vol s, MOD A4C: 43.7 ml LV SV MOD A2C:     93.6 ml LV SV MOD A4C:     116.0 ml LV SV MOD BP:      83.9 ml RIGHT VENTRICLE             IVC RV S prime:     14.90 cm/s  IVC diam: 2.00 cm TAPSE (M-mode): 2.6 cm LEFT ATRIUM             Index       RIGHT ATRIUM           Index LA diam:        3.90 cm 1.61 cm/m  RA Area:     13.80 cm LA Vol (A2C):   52.7 ml 21.77 ml/m RA Volume:   34.60 ml  14.29 ml/m LA Vol (A4C):   29.1 ml 12.02 ml/m LA Biplane Vol: 40.7 ml 16.81 ml/m  AORTIC VALVE LVOT Vmax:   104.00 cm/s LVOT Vmean:  76.300 cm/s LVOT VTI:    0.203 m  AORTA Ao Root diam: 3.90 cm Ao Asc diam:  3.60 cm MITRAL VALVE MV Area (PHT): 3.99 cm    SHUNTS MV Decel Time: 190 msec    Systemic VTI:  0.20 m MV E velocity: 73.70 cm/s  Systemic Diam: 2.60 cm MV A velocity: 47.20 cm/s MV E/A ratio:  1.56 Marca Ancona MD Electronically signed by Marca Ancona MD Signature Date/Time: 05/13/2019/5:44:04 PM    Final     (Echo, Carotid, EGD, Colonoscopy, ERCP)    Subjective: Resting in bed  Discharge Exam: Vitals:   05/14/19 0547 05/14/19 1153  BP: 134/86 135/84   Pulse: 90 68  Resp: 18 20  Temp: 99.5 F (37.5 C) 98.2 F (36.8 C)  SpO2: 99% 100%   Vitals:   05/13/19 1801 05/13/19 2111 05/14/19 0547 05/14/19 1153  BP: (!) 145/92 (!) 145/71 134/86 135/84  Pulse: 87 80 90 68  Resp: Temp: 100.3 F (37.9 C) 99.4 F (37.4 C) 99.5 F (37.5 C) 98.2 F (36.8 C)  TempSrc: Oral Oral Oral   SpO2: 100% 97% 99% 100%  Weight: 124.5 kg  121.1 kg   Height:  (1.905 m)       General: Pt is alert, awake, not in acute distress Cardiovascular: RRR, S1/S2 +, no rubs, no gallops Respiratory: CTA bilaterally, no wheezing, no rhonchi Abdominal: Soft, NT, ND, bowel sounds + Extremities: no edema, no cyanosis    The results of significant diagnostics from this hospitalization (including imaging, microbiology, ancillary and laboratory) are listed below for reference.     Microbiology: Recent Results (from the past 240 hour(s))  Blood Culture (routine x 2)     Status: None   Collection Time: 05/12/19 10:45 PM   Specimen: BLOOD LEFT WRIST  Result Value Ref Range Status   Specimen Description   Final    BLOOD LEFT WRIST Performed at [facility], [address], [place], [place] [address]    Special Requests   Final    BOTTLES DRAWN AEROBIC AND ANAEROBIC Blood Culture adequate volume Performed at [facility], [address] [place], [place] [address]    Culture   Final  NO GROWTH 5 DAYS Performed at [facility], [address] [place], [place] [address]    Report Status 05/18/2019 FINAL  Final  Blood Culture (routine x 2)     Status: None   Collection Time: 05/12/19 10:54 PM   Specimen: BLOOD LEFT FOREARM  Result Value Ref Range Status   Specimen Description   Final    BLOOD LEFT FOREARM Performed at [facility], [address] [place], [place] [address]    Special Requests   Final    BOTTLES DRAWN AEROBIC AND ANAEROBIC Blood Culture results may not be optimal due to an excessive  volume of blood received in culture bottles Performed at [facility], [address] [place], [place] [address]    Culture   Final    NO GROWTH 5 DAYS Performed at [facility], [address] [place], [place] [address]    Report Status 05/18/2019 FINAL  Final  Urine culture     Status: None   Collection Time: 05/13/19 12:34 AM   Specimen: In/Out Cath Urine  Result Value Ref Range Status   Specimen Description   Final    IN/OUT CATH URINE Performed at [facility], [address], [place], [place] [address]    Special Requests   Final    NONE Performed at [facility] [address], [place], [place] [address]    Culture   Final    NO GROWTH Performed at [facility], [address], [place], [place] [address]    Report Status 05/14/2019 FINAL  Final  Respiratory Panel by RT PCR (Flu A&B, Covid) -     Status: None   Collection Time: 05/13/19  1:13 AM  Result Value Ref Range Status   SARS Coronavirus 2 by RT PCR NEGATIVE NEGATIVE Final    Comment: (NOTE) SARS-CoV-2 target nucleic acids are NOT DETECTED. The SARS-CoV-2 RNA is generally detectable in upper respiratoy specimens during the acute phase of infection. The lowest concentration of SARS-CoV-2 viral copies this assay can detect is 131 copies/mL. A negative result does not preclude SARS-Cov-2 infection and should not be used as the sole basis for treatment or other patient management decisions. A negative result may occur with  improper specimen collection/handling, submission of specimen other than nasopharyngeal swab, presence of viral mutation(s) within the areas targeted by this assay, and inadequate number of viral copies (<131 copies/mL). A negative result must be combined with clinical observations, patient history, and epidemiological information. The expected result is Negative. Fact Sheet for Patients:   https://www.moore.com/ Fact Sheet for Healthcare Providers:  https://www.young.biz/ This test is not yet ap proved or cleared by the [place] FDA and  has been authorized for detection and/or diagnosis of SARS-CoV-2 by FDA under an Emergency Use Authorization (EUA). This EUA will remain  in effect (meaning this test can be used) for the duration of the COVID-19 declaration under Section 564(b)(1) of the Act, 21 U.S.C. section 360bbb-3(b)(1), unless the authorization is terminated or revoked sooner.    Influenza A by PCR NEGATIVE NEGATIVE Final   Influenza B by PCR NEGATIVE NEGATIVE Final    Comment: (NOTE) The Xpert Xpress SARS-CoV-2/FLU/RSV assay is intended as an aid in  the diagnosis of influenza from Nasopharyngeal swab specimens and  should not be used as a sole basis for treatment. Nasal washings and  aspirates are unacceptable for Xpert Xpress SARS-CoV-2/FLU/RSV  testing. Fact Sheet for Patients:  https://www.moore.com/https://www.fda.gov/media/142436/download Fact Sheet for Healthcare Providers: https://www.young.biz/https://www.fda.gov/media/142435/download This test is not yet approved or cleared by the [place] FDA and  has been authorized for detection and/or diagnosis of SARS-CoV-2 by  FDA under an Emergency Use Authorization (EUA). This EUA will remain  in effect (meaning this test can be used) for the duration of the  Covid-19 declaration under Section 564(b)(1) of the Act, 21  U.S.C. section 360bbb-3(b)(1), unless the authorization is  terminated or revoked. Performed at [facility], [address] [place], [place] [address]      Labs: BNP (last 3 results) No results for input(s): BNP in the last 8760 hours. Basic Metabolic Panel: Recent Labs  Lab 05/12/19 2245 05/13/19 0604 05/14/19 0506  NA 133* 136 137  K 3.7 4.3 4.5  CL 99 105 105  CO2 24 24 23   GLUCOSE 89 90 103*  BUN 12 12 10   CREATININE 1.11 1.10 1.29*  CALCIUM 8.5*  8.0* 8.4*  MG  --  2.1  --   PHOS  --  4.3  --    Liver Function Tests: Recent Labs  Lab 05/12/19 2245 05/13/19 0604 05/14/19 0506  AST 721* 568* 463*  ALT 256* 214* 210*  ALKPHOS 59 53 54  BILITOT 0.4 0.9 0.6  PROT 7.4 6.2* 6.1*  ALBUMIN 3.6 2.9* 2.9*   No results for input(s): LIPASE, AMYLASE in the last 168 hours. No results for input(s): AMMONIA in the last 168 hours. CBC: Recent Labs  Lab 05/12/19 2245 05/13/19 0604 05/14/19 0506  WBC 13.2* 12.1* 13.5*  NEUTROABS 4.4 4.1  --   HGB 15.2 13.5 13.6  HCT 46.2 42.0 41.9  MCV 84.6 84.7 84.6  PLT 374 297 316   Cardiac Enzymes: Recent Labs  Lab 05/12/19 2245 05/14/19 0506  CKTOTAL >50,000* >50,000*   BNP: Invalid input(s): POCBNP CBG: No results for input(s): GLUCAP in the last 168 hours. D-Dimer No results for input(s): DDIMER in the last 72 hours. Hgb A1c No results for input(s): HGBA1C in the last 72 hours. Lipid Profile No results for input(s): CHOL, HDL, LDLCALC, TRIG, CHOLHDL, LDLDIRECT in the last 72 hours. Thyroid function studies No results for input(s): TSH, T4TOTAL, T3FREE, THYROIDAB in the last 72 hours.  Invalid input(s): FREET3 Anemia work up No results for input(s): VITAMINB12, FOLATE, FERRITIN, TIBC, IRON, RETICCTPCT in the last 72 hours. Urinalysis    Component Value Date/Time   COLORURINE AMBER (A) 05/13/2019 0034   APPEARANCEUR CLOUDY (A) 05/13/2019 0034   LABSPEC 1.019 05/13/2019 0034   PHURINE 5.0 05/13/2019 0034   GLUCOSEU NEGATIVE 05/13/2019 0034   HGBUR LARGE (A) 05/13/2019 0034   BILIRUBINUR NEGATIVE 05/13/2019 0034   KETONESUR NEGATIVE 05/13/2019 0034   PROTEINUR 100 (A) 05/13/2019 0034   NITRITE NEGATIVE 05/13/2019 0034   LEUKOCYTESUR NEGATIVE 05/13/2019 0034   Sepsis Labs Invalid input(s): PROCALCITONIN,  WBC,  LACTICIDVEN Microbiology Recent Results (from the past 240 hour(s))  Blood Culture (routine x 2)     Status: None   Collection Time: 05/12/19 10:45 PM    Specimen: BLOOD LEFT WRIST  Result Value Ref Range Status   Specimen Description   Final    BLOOD LEFT WRIST Performed at [facility], [address] [place], [place] [address]    Special Requests   Final    BOTTLES DRAWN AEROBIC AND ANAEROBIC Blood Culture adequate volume Performed at [facility], [address] [place], [place] [address]    Culture  Final    NO GROWTH 5 DAYS Performed at [facility], [place] [address], [place], [place] [address]    Report Status 05/18/2019 FINAL  Final  Blood Culture (routine x 2)     Status: None   Collection Time: 05/12/19 10:54 PM   Specimen: BLOOD LEFT FOREARM  Result Value Ref Range Status   Specimen Description   Final    BLOOD LEFT FOREARM Performed at [facility], [place] [address] [place], [place] [address]    Special Requests   Final    BOTTLES DRAWN AEROBIC AND ANAEROBIC Blood Culture results may not be optimal due to an excessive volume of blood received in culture bottles Performed at [place] [address], [place], [place] [address]    Culture   Final    NO GROWTH 5 DAYS Performed at [facility], [place] [address], [place], [place] [address]    Report Status 05/18/2019 FINAL  Final  Urine culture     Status: None   Collection Time: 05/13/19 12:34 AM   Specimen: In/Out Cath Urine  Result Value Ref Range Status   Specimen Description   Final    IN/OUT CATH URINE Performed at [place] [address] [place], [place] [address]    Special Requests   Final    NONE Performed at [facility], [place] [address], [place], [place] [address]    Culture   Final    NO GROWTH Performed at [facility], [place] [address] [place],  [address]    Report Status 05/14/2019 FINAL  Final  Respiratory Panel by RT PCR (Flu A&B, Covid) -     Status: None   Collection Time: 05/13/19  1:13 AM  Result Value Ref Range Status    SARS Coronavirus 2 by RT PCR NEGATIVE NEGATIVE Final    Comment: (NOTE) SARS-CoV-2 target nucleic acids are NOT DETECTED. The SARS-CoV-2 RNA is generally detectable in upper respiratoy specimens during the acute phase of infection. The lowest concentration of SARS-CoV-2 viral copies this assay can detect is 131 copies/mL. A negative result does not preclude SARS-Cov-2 infection and should not be used as the sole basis for treatment or other patient management decisions. A negative result may occur with  improper specimen collection/handling, submission of specimen other than nasopharyngeal swab, presence of viral mutation(s) within the areas targeted by this assay, and inadequate number of viral copies (<131 copies/mL). A negative result must be combined with clinical observations, patient history, and epidemiological information. The expected result is Negative. Fact Sheet for Patients:  PinkCheek.be Fact Sheet for Healthcare Providers:  GravelBags.it This test is not yet ap proved or cleared by the [place] FDA and  has been authorized for detection and/or diagnosis of SARS-CoV-2 by FDA under an Emergency Use Authorization (EUA). This EUA will remain  in effect (meaning this test can be used) for the duration of the COVID-19 declaration under Section 564(b)(1) of the Act, 21 U.S.C. section 360bbb-3(b)(1), unless the authorization is terminated or revoked sooner.    Influenza A by PCR NEGATIVE NEGATIVE Final   Influenza B by PCR NEGATIVE NEGATIVE Final    Comment: (NOTE) The Xpert Xpress SARS-CoV-2/FLU/RSV assay is intended as an aid in  the diagnosis of influenza from Nasopharyngeal swab specimens and  should not be used as a sole basis for treatment. Nasal washings and  aspirates are unacceptable for Xpert Xpress SARS-CoV-2/FLU/RSV  testing. Fact  Sheet for Patients: https://www.moore.com/ Fact  Sheet for Healthcare Providers: https://www.young.biz/ This test is not yet approved or cleared by the [place] FDA and  has been authorized for detection and/or diagnosis of SARS-CoV-2 by  FDA under an Emergency Use Authorization (EUA). This EUA will remain  in effect (meaning this test can be used) for the duration of the  Covid-19 declaration under Section 564(b)(1) of the Act, 21  U.S.C. section 360bbb-3(b)(1), unless the authorization is  terminated or revoked. Performed at [facility], [address] [place], [place] [address]      Time coordinating discharge:  38 minutes  SIGNED:   Alwyn Ren, MD  [facility] 05/18/2019, 4:49 PM Pager   If 7PM-7AM, please contact night-coverage [email or web address] Password [email or web address]

## 2020-09-25 ENCOUNTER — Emergency Department (HOSPITAL_COMMUNITY)
Admission: EM | Admit: 2020-09-25 | Discharge: 2020-09-25 | Disposition: A | Discharge status: Home / Self Care | Payer: Self-pay | Attending: Emergency Medicine | Admitting: Emergency Medicine

## 2020-09-25 ENCOUNTER — Encounter (HOSPITAL_COMMUNITY): Payer: Self-pay | Admitting: Radiology

## 2020-09-25 ENCOUNTER — Emergency Department (HOSPITAL_COMMUNITY): Payer: Self-pay

## 2020-09-25 ENCOUNTER — Other Ambulatory Visit: Payer: Self-pay

## 2020-09-25 DIAGNOSIS — Z20822 Contact with and (suspected) exposure to covid-19: Secondary | ICD-10-CM | POA: Insufficient documentation

## 2020-09-25 DIAGNOSIS — K529 Noninfective gastroenteritis and colitis, unspecified: Secondary | ICD-10-CM | POA: Insufficient documentation

## 2020-09-25 DIAGNOSIS — F17210 Nicotine dependence, cigarettes, uncomplicated: Secondary | ICD-10-CM | POA: Insufficient documentation

## 2020-09-25 LAB — CBC WITH DIFFERENTIAL/PLATELET
Abs Immature Granulocytes: 0.25 10*3/uL — ABNORMAL HIGH (ref 0.00–0.07)
Basophils Absolute: 0 10*3/uL (ref 0.0–0.1)
Basophils Relative: 0 %
Eosinophils Absolute: 0.1 10*3/uL (ref 0.0–0.5)
Eosinophils Relative: 1 %
HCT: 48.3 % (ref 39.0–52.0)
Hemoglobin: 16 g/dL (ref 13.0–17.0)
Immature Granulocytes: 2 %
Lymphocytes Relative: 5 %
Lymphs Abs: 0.7 10*3/uL (ref 0.7–4.0)
MCH: 28.1 pg (ref 26.0–34.0)
MCHC: 33.1 g/dL (ref 30.0–36.0)
MCV: 84.9 fL (ref 80.0–100.0)
Monocytes Absolute: 0.7 10*3/uL (ref 0.1–1.0)
Monocytes Relative: 6 %
Neutro Abs: 11.1 10*3/uL — ABNORMAL HIGH (ref 1.7–7.7)
Neutrophils Relative %: 86 %
Platelets: 359 10*3/uL (ref 150–400)
RBC: 5.69 MIL/uL (ref 4.22–5.81)
RDW: 15.5 % (ref 11.5–15.5)
WBC: 12.8 10*3/uL — ABNORMAL HIGH (ref 4.0–10.5)
nRBC: 0 % (ref 0.0–0.2)

## 2020-09-25 LAB — COMPREHENSIVE METABOLIC PANEL
ALT: 27 U/L (ref 0–44)
AST: 31 U/L (ref 15–41)
Albumin: 4.5 g/dL (ref 3.5–5.0)
Alkaline Phosphatase: 47 U/L (ref 38–126)
Anion gap: 12 (ref 5–15)
BUN: 16 mg/dL (ref 6–20)
CO2: 19 mmol/L — ABNORMAL LOW (ref 22–32)
Calcium: 9.7 mg/dL (ref 8.9–10.3)
Chloride: 104 mmol/L (ref 98–111)
Creatinine, Ser: 1.22 mg/dL (ref 0.61–1.24)
GFR, Estimated: >60 mL/min (ref >60)
Glucose, Bld: 126 mg/dL — ABNORMAL HIGH (ref 70–99)
Potassium: 3.5 mmol/L (ref 3.5–5.1)
Sodium: 135 mmol/L (ref 135–145)
Total Bilirubin: 1 mg/dL (ref 0.3–1.2)
Total Protein: 7.7 g/dL (ref 6.5–8.1)

## 2020-09-25 LAB — RESP PANEL BY RT-PCR (FLU A&B, COVID) ARPGX2
Influenza A by PCR: NEGATIVE
Influenza B by PCR: NEGATIVE
SARS Coronavirus 2 by RT PCR: NEGATIVE

## 2020-09-25 LAB — LIPASE, BLOOD: Lipase: 30 U/L (ref 11–51)

## 2020-09-25 MED ORDER — IOHEXOL 350 MG/ML SOLN
75.0000 mL | Freq: Once | INTRAVENOUS | Status: AC | PRN
  Administered 2020-09-25: 75 mL via INTRAVENOUS

## 2020-09-25 MED ORDER — DICYCLOMINE HCL 20 MG PO TABS: 20.0000 mg | ORAL_TABLET | Freq: Two times a day (BID) | ORAL | 0 refills | Status: DC

## 2020-09-25 MED ORDER — ONDANSETRON HCL 4 MG/2ML IJ SOLN
4.0000 mg | Freq: Once | INTRAMUSCULAR | Status: AC
  Administered 2020-09-25: 4 mg via INTRAVENOUS
  Filled 2020-09-25: qty 2

## 2020-09-25 MED ORDER — MORPHINE SULFATE (PF) 4 MG/ML IV SOLN
4.0000 mg | Freq: Once | INTRAVENOUS | Status: AC
  Administered 2020-09-25: 4 mg via INTRAVENOUS
  Filled 2020-09-25: qty 1

## 2020-09-25 MED ORDER — SODIUM CHLORIDE 0.9 % IV BOLUS
1000.0000 mL | Freq: Once | INTRAVENOUS | Status: AC
  Administered 2020-09-25: 1000 mL via INTRAVENOUS

## 2020-09-25 MED ORDER — ONDANSETRON 4 MG PO TBDP: 4.0000 mg | ORAL_TABLET | Freq: Three times a day (TID) | ORAL | 0 refills | Status: DC | PRN

## 2020-09-25 NOTE — ED Triage Notes (Signed)
Pt reports generalized abdominal pain with diarrhea x 10 episodes onset yesterday. Symptoms resolved for a bit then recurred last night. Endorses fatigue and generalized body aches. Hx of kidney and liver failure.

## 2020-09-25 NOTE — Discharge Instructions (Addendum)
Your CT scan showed enteritis, which is essentially inflammation of the intestines.  I think that this is less likely to be caused by a bacterial process, given that you have not had fever and you did not have any significantly elevated white blood cell count.  Treatment is fluids and rest.  Take medications as prescribed.  If symptoms change or worsen, please return to the emergency department.  You should begin to feel better in the next couple of days.

## 2020-09-25 NOTE — ED Provider Notes (Signed)
[facility] EMERGENCY DEPARTMENT Provider Note   CSN: [id number] Arrival date & time: 09/25/20  1007     History Chief Complaint  Patient presents with   Abdominal Pain    Philip Bautista is a 35 y.o. male.  Patient presents to the emergency department with a chief complaint of generalized abdominal pain.  He reports multiple episodes of diarrhea since yesterday.  Onset of symptoms was last night.  Denies any fevers or chills.  Denies any cough.  Denies any prior abdominal surgeries.  States he has tried drinking Gatorade with little relief.  Denies known sick contacts.  The history is provided by the patient. No language interpreter was used.      Past Medical History:  Diagnosis Date   Obesity     Patient Active Problem List   Diagnosis Date Noted   Acute hepatitis    Rhabdomyolysis 05/13/2019   Fever    Contusion of right foot 10/17/2017    No past surgical history on file.     Family History  Problem Relation Age of Onset   Diabetes Mellitus II Neg Hx     Social History   Tobacco Use   Smoking status: Every Day    Packs/day: 1.00    Types: Cigarettes   Smokeless tobacco: Never  Vaping Use   Vaping Use: Never used  Substance Use Topics   Alcohol use: Yes   Drug use: Yes    Types: Marijuana    Comment: last use x 1 month ago    Home Medications Prior to Admission medications   Medication Sig Start Date End Date Taking? Authorizing Provider  cholecalciferol (VITAMIN D3) 25 MCG (1000 UNIT) tablet Take 1,000 Units by mouth daily.    [provider]  loratadine (CLARITIN) 10 MG tablet Take 10 mg by mouth daily.    [provider]  pantoprazole (PROTONIX) 40 MG tablet Take 1 tablet (40 mg total) by mouth daily. Patient not taking: Reported on 11/26/2015 11/07/15   Cathren Laine, MD    Allergies    Lentil  Review of Systems   Review of Systems  All other systems reviewed and are negative.  Physical  Exam Updated Vital Signs BP (!) 126/102 (BP Location: Right Arm)   Pulse 80   Temp 98.5 F (36.9 C) (Oral)   Resp 16   Ht 6\' 3"  (1.905 m)   SpO2 100%   BMI 33.37 kg/m   Physical Exam Vitals and nursing note reviewed.  Constitutional:      Appearance: He is well-developed.  HENT:     Head: Normocephalic and atraumatic.  Eyes:     Conjunctiva/sclera: Conjunctivae normal.  Cardiovascular:     Rate and Rhythm: Normal rate and regular rhythm.     Heart sounds: No murmur heard. Pulmonary:     Effort: Pulmonary effort is normal. No respiratory distress.     Breath sounds: Normal breath sounds.  Abdominal:     Palpations: Abdomen is soft.     Tenderness: There is abdominal tenderness.  Musculoskeletal:        General: Normal range of motion.     Cervical back: Neck supple.  Skin:    General: Skin is warm and dry.  Neurological:     Mental Status: He is alert and oriented to person, place, and time.  Psychiatric:        Mood and Affect: Mood normal.        Behavior: Behavior normal.  ED Results / Procedures / Treatments   Labs (all labs ordered are listed, but only abnormal results are displayed) Labs Reviewed  CBC WITH DIFFERENTIAL/PLATELET  COMPREHENSIVE METABOLIC PANEL  LIPASE, BLOOD    EKG None  Radiology No results found.  Procedures Procedures   Medications Ordered in ED Medications  sodium chloride 0.9 % bolus 1,000 mL (has no administration in time range)  ondansetron (ZOFRAN) injection 4 mg (has no administration in time range)  morphine 4 MG/ML injection 4 mg (has no administration in time range)    ED Course  I have reviewed the triage vital signs and the nursing notes.  Pertinent labs & imaging results that were available during my care of the patient were reviewed by me and considered in my medical decision making (see chart for details).    MDM Rules/Calculators/A&P                           Patient here with generalized abdominal  cramps and multiple episodes of diarrhea.  He has not had any fever.  Onset of symptoms was last night.  Laboratory work-up is fairly reassuring.  Normal LFTs, no sign of kidney injury.  White blood cell count is 3.7.  Patient is afebrile and nontoxic in appearance.  He does have some generalized abdominal discomfort.  CT shows findings consistent with enteritis, likely viral.  Patient feels somewhat improved after fluids.  Discharged home with Zofran and Bentyl.  Return precautions discussed.  Patient understands and agrees the plan.  He is stable and ready for discharge. Final Clinical Impression(s) / ED Diagnoses Final diagnoses:  Enteritis    Rx / DC Orders ED Discharge Orders     None        Roxy Horseman, PA-C 09/25/20 1306    Cheryll Cockayne, MD 09/27/20 [phone]

## 2020-11-26 IMAGING — DX DG CHEST 1V PORT
1 series · 1 of 1 positions shown · non-contrast
Comparison: 01/22/2018

CLINICAL DATA: Fever and body aches.

EXAM:
PORTABLE CHEST 1 VIEW

[chest ap]
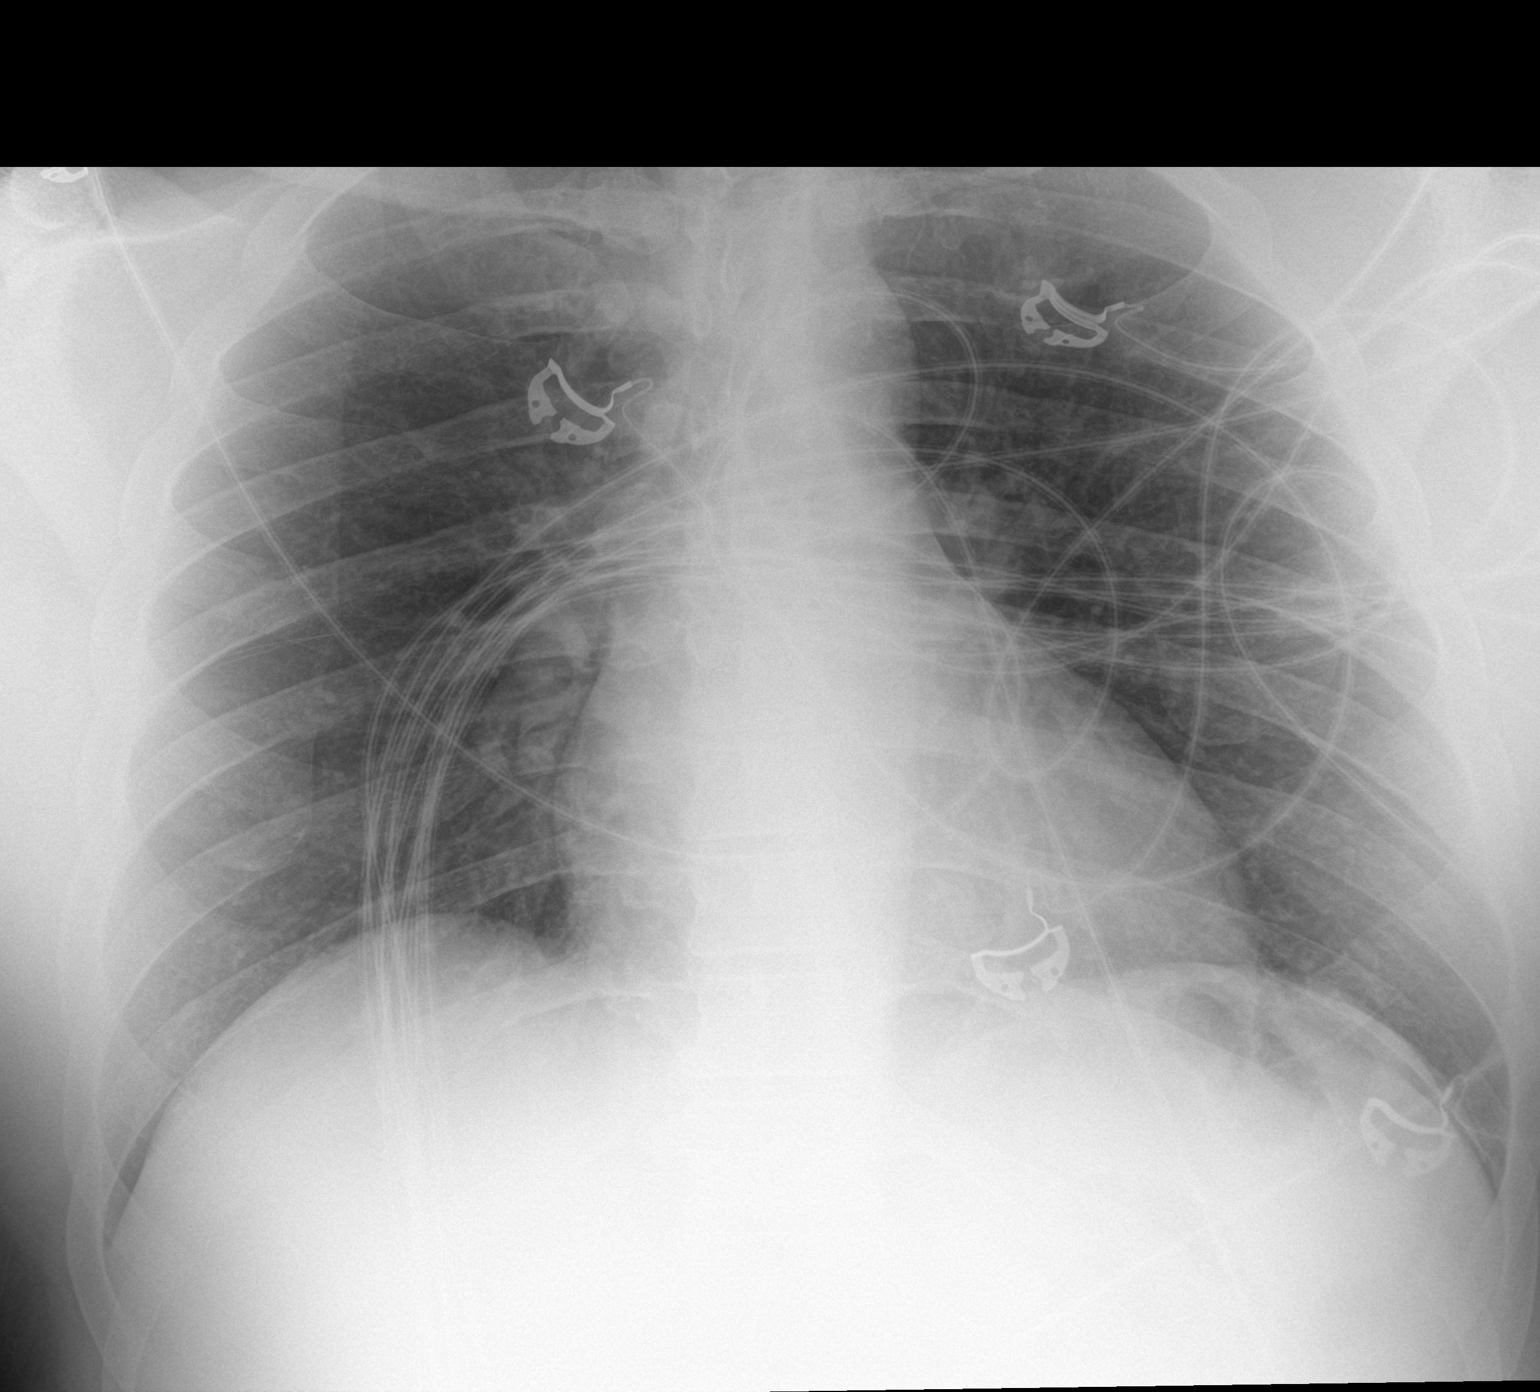

[1 of 1 positions shown; findings below may reference images not displayed]

FINDINGS: Multiple overlying EKG wires.The cardiomediastinal contours are
normal. The lungs are clear. Pulmonary vasculature is normal. No
consolidation, pleural effusion, or pneumothorax. No acute osseous
abnormalities are seen.
IMPRESSION: No acute chest findings.

## 2022-04-12 IMAGING — CT CT ABD-PELV W/ CM
2 of 4 series · 16 of 46 positions shown, 18 images · IV contrast (omnipaque)
Comparison: None.

CLINICAL DATA: Diffuse abdominal pain with diarrhea. Ongoing for
the last week.

EXAM:
CT ABDOMEN AND PELVIS WITH CONTRAST
TECHNIQUE: Multidetector CT imaging of the abdomen and pelvis was performed
using the standard protocol following bolus administration of
intravenous contrast.
CONTRAST:  75mL OMNIPAQUE IOHEXOL 350 MG/ML SOLN

[Series 3: abdomen 5.0 · axial · 0.91mm/px · z∈[-282,+162]mm · 13 of 101 slices shown, 15 images]
[im 6/101  soft-tissue]
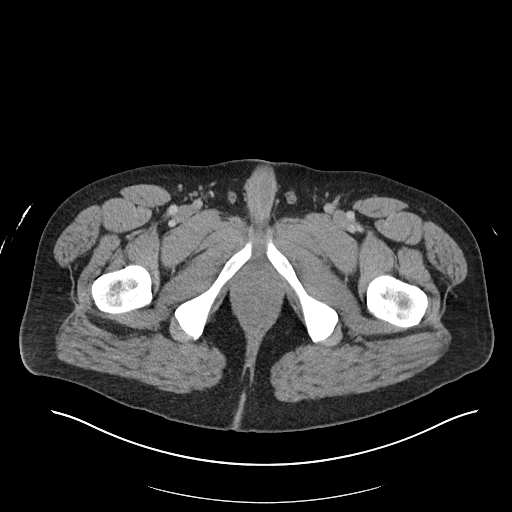
[im 6/101  bone]
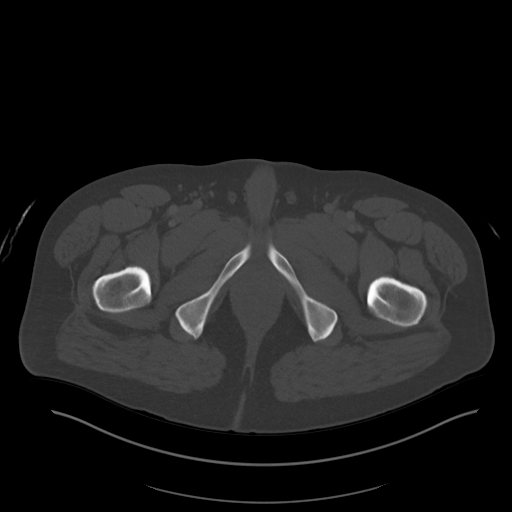
[im 12/101  soft-tissue]
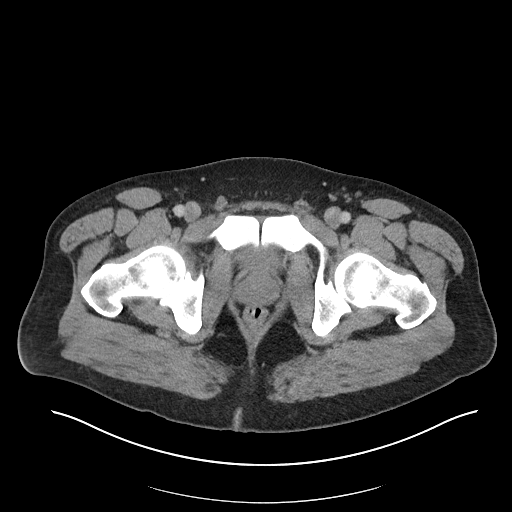
[im 23/101  soft-tissue]
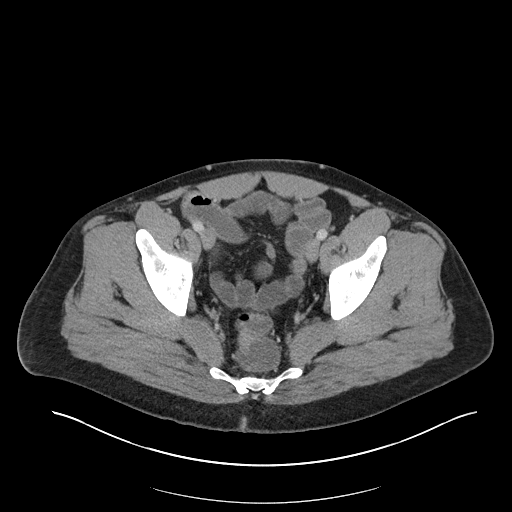
[im 28/101  soft-tissue]
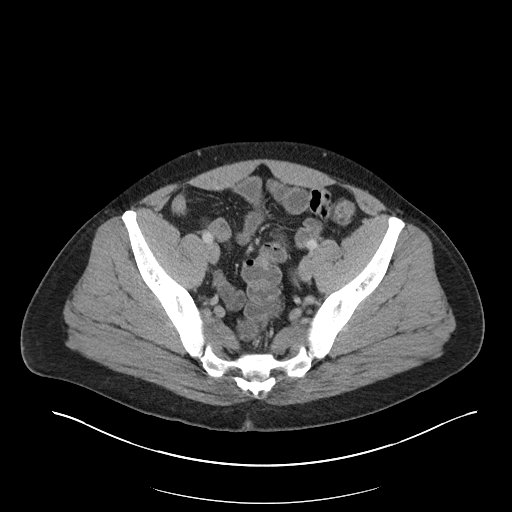
[im 34/101  soft-tissue]
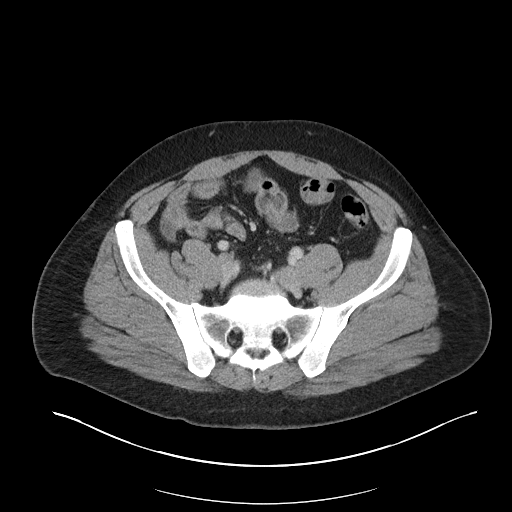
[im 45/101  soft-tissue]
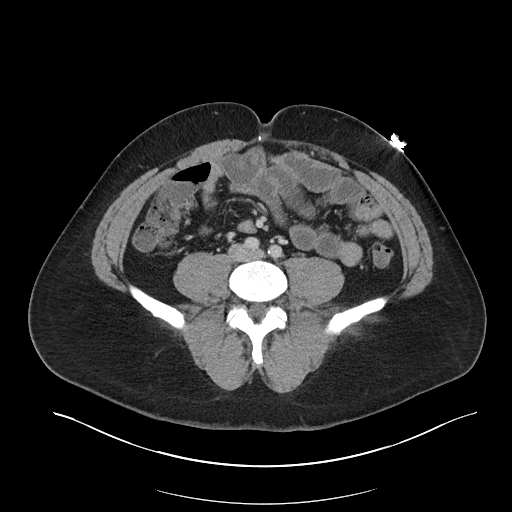
[im 51/101  soft-tissue]
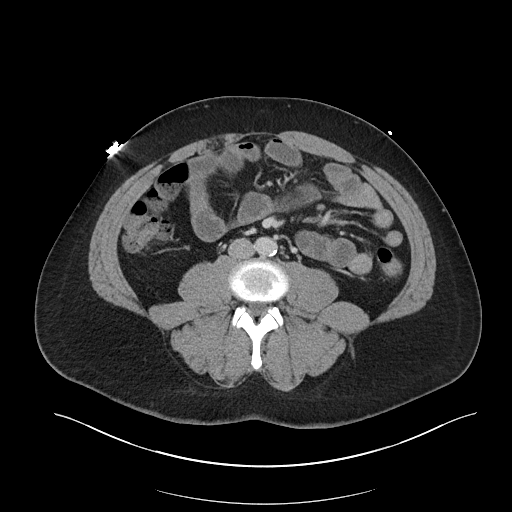
[im 56/101  soft-tissue]
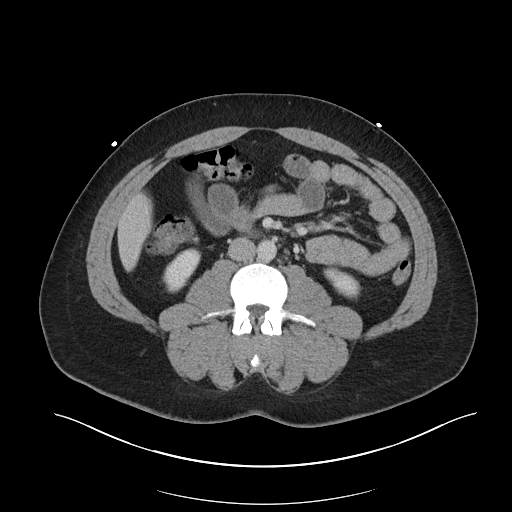
[im 67/101  soft-tissue]
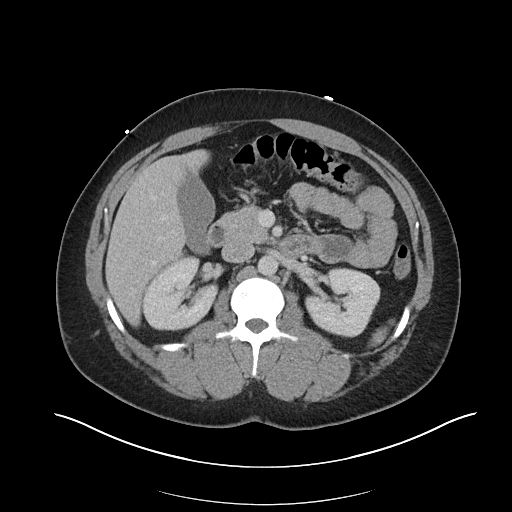
[im 67/101  bone]
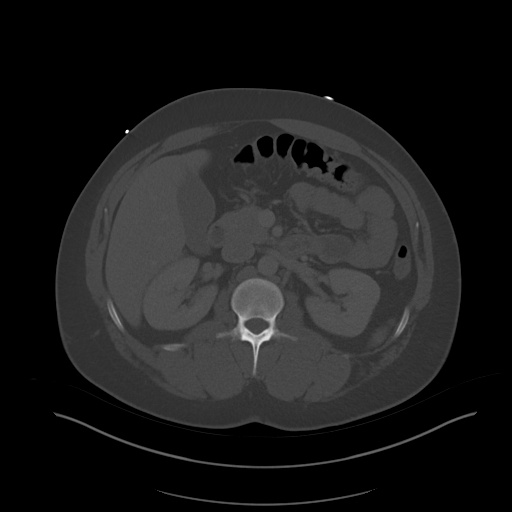
[im 73/101  soft-tissue]
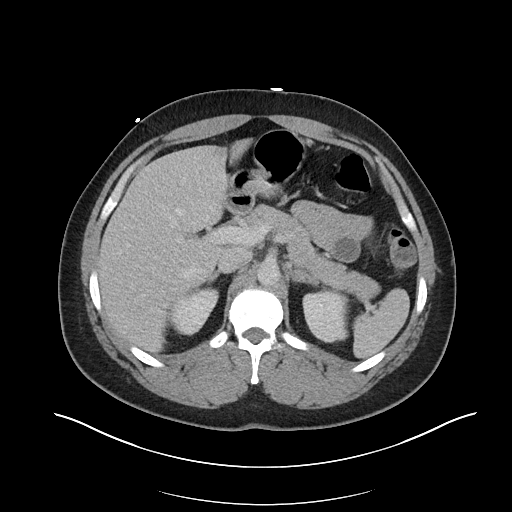
[im 78/101  soft-tissue]
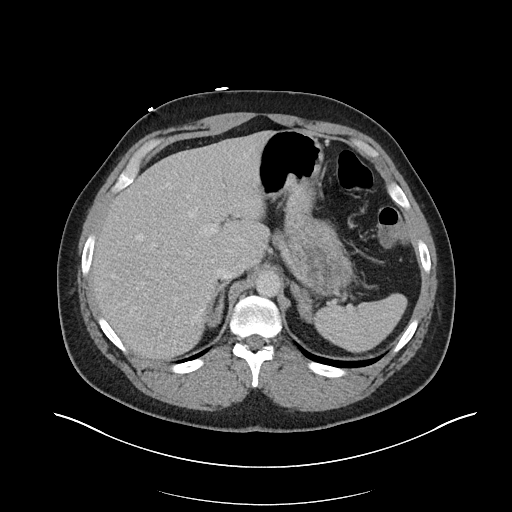
[im 89/101  soft-tissue]
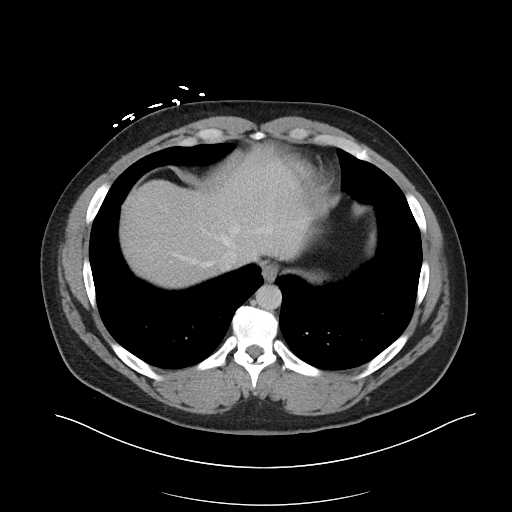
[im 95/101  soft-tissue]
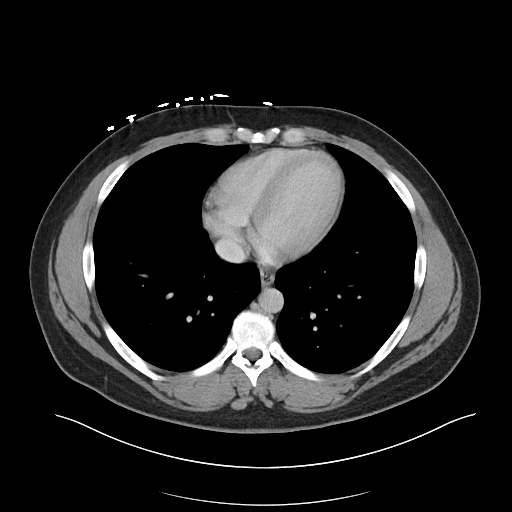

[Series 6: abdomen 3.0 mpr cor · coronal · 0.88mm/px · 3 of 91 slices shown]
[im 31/91  soft-tissue]
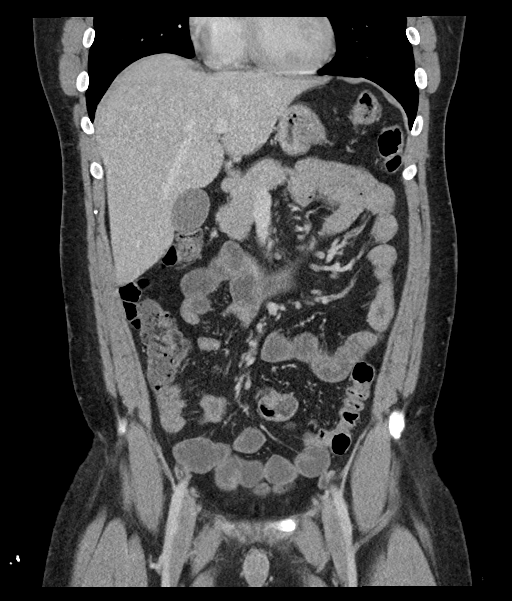
[im 41/91  soft-tissue]
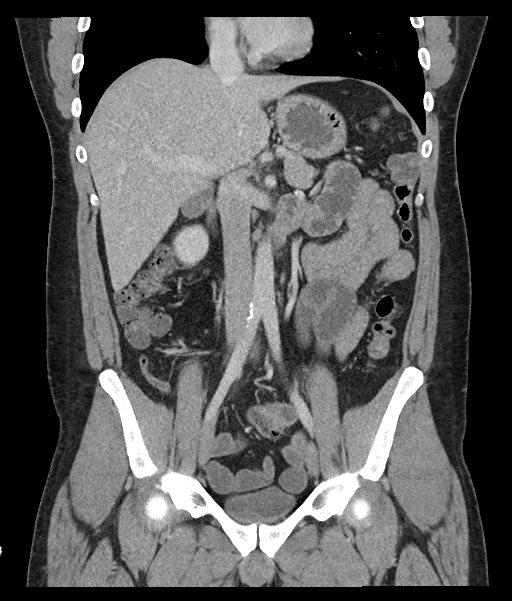
[im 51/91  soft-tissue]
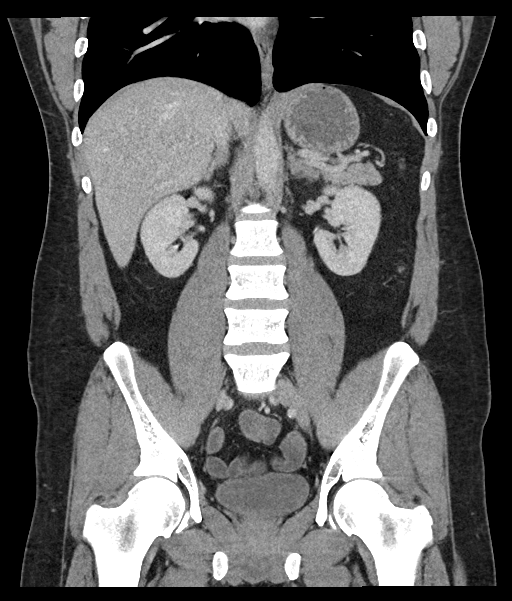

[16 of 46 positions shown; findings below may reference images not displayed]

FINDINGS: Lower chest: No acute abnormality.

Hepatobiliary: Tiny nonspecific hypodensity in the right hepatic
lobe. No suspicious mass. Normal appearance of the gallbladder. No
intra or extrahepatic biliary duct dilation.

Pancreas: Unremarkable. No pancreatic ductal dilatation or
surrounding inflammatory changes.

Spleen: Normal in size without focal abnormality.

Adrenals/Urinary Tract: Adrenal glands are unremarkable. Kidneys are
normal, without renal calculi, focal lesion, or hydronephrosis.
Bladder is unremarkable.

Stomach/Bowel: Stomach is within normal limits. Appendix appears
normal. There are multiple nondilated fluid-filled loops of small
bowel. No evidence of bowel wall thickening, inflammatory changes or
obstruction.

Vascular/Lymphatic: Minimal aortic atherosclerosis. No enlarged
abdominal or pelvic lymph nodes.

Reproductive: Prostate is unremarkable.

Other: No abdominal wall hernia or abnormality. No abdominopelvic
ascites.

Musculoskeletal: Bilateral L5 pars defects.  No listhesis.
IMPRESSION: 1. Multiple nondilated fluid-filled loops of small bowel may
represent enteritis. No evidence of bowel obstruction.
2. Bilateral L5 pars defects.
3. Aortic atherosclerosis.

Aortic Atherosclerosis (SEYV2-AH8.8).

## 2022-06-07 ENCOUNTER — Ambulatory Visit
Admission: EM | Admit: 2022-06-07 | Discharge: 2022-06-07 | Disposition: A | Discharge status: Home / Self Care | Payer: Self-pay | Attending: Nurse Practitioner | Admitting: Nurse Practitioner

## 2022-06-07 DIAGNOSIS — Z202 Contact with and (suspected) exposure to infections with a predominantly sexual mode of transmission: Secondary | ICD-10-CM | POA: Insufficient documentation

## 2022-06-07 NOTE — Discharge Instructions (Signed)
Your swab is pending.  You will receive a call from the nurse or the results of your test.  If there are any positive test you will be prescribed the appropriate treatment at that time.  If you develop symptoms or symptoms persist or any worse please follow-up for reevaluation.

## 2022-06-07 NOTE — ED Triage Notes (Signed)
Patient presents to [facility] for STD testing. States he was exposed to [employer].

## 2022-06-07 NOTE — ED Provider Notes (Signed)
[facility]    CSN: [id number] Arrival date & time: 06/07/22  4098      History   Chief Complaint Chief Complaint  Patient presents with   SEXUALLY TRANSMITTED DISEASE    HPI Philip Bautista is a 37 y.o. male.   HPI He is in today for evaluation of STI.  He reports that he is partner was diagnosed with trichomonas.  He denies any current symptoms.Denies oral lesions,, sore throat, pelvic pain, penile discharge, dysuria, nocturia any visible ulcers or lesions.  He reports that he was positive for trichomonas several years ago.  Denies any other concerns today.  Past Medical History:  Diagnosis Date   Obesity     Patient Active Problem List   Diagnosis Date Noted   Acute hepatitis    Rhabdomyolysis 05/13/2019   Fever    Contusion of right foot 10/17/2017    History reviewed. No pertinent surgical history.     Home Medications    Prior to Admission medications   Not on File    Family History Family History  Problem Relation Age of Onset   Diabetes Mellitus II Neg Hx     Social History Social History   Tobacco Use   Smoking status: Every Day    Packs/day: 1    Types: Cigarettes   Smokeless tobacco: Never  Vaping Use   Vaping Use: Never used  Substance Use Topics   Alcohol use: Yes   Drug use: Yes    Types: Marijuana    Comment: last use x 1 month ago     Allergies   Lentil   Review of Systems Review of Systems   Physical Exam Triage Vital Signs ED Triage Vitals  Enc Vitals Group     BP 06/07/22 0941 (!) 155/114     Pulse Rate 06/07/22 0941 87     Resp 06/07/22 0941 16     Temp 06/07/22 0941 98.9 F (37.2 C)     Temp Source 06/07/22 0941 Oral     SpO2 06/07/22 0941 99 %     Weight --      Height --      Head Circumference --      Peak Flow --      Pain Score 06/07/22 0940 0     Pain Loc --      Pain Edu? --      Excl. in GC? --    No data found.  Updated Vital Signs BP (!) 155/114 (BP Location: Left Arm)    Pulse 87   Temp 98.9 F (37.2 C) (Oral)   Resp 16   SpO2 99%   Visual Acuity Right Eye Distance:   Left Eye Distance:   Bilateral Distance:    Right Eye Near:   Left Eye Near:    Bilateral Near:     Physical Exam Constitutional:      General: He is not in acute distress.    Appearance: He is obese.  HENT:     Head: Normocephalic.  Cardiovascular:     Rate and Rhythm: Normal rate.  Pulmonary:     Effort: Pulmonary effort is normal.  Musculoskeletal:        General: Normal range of motion.  Skin:    General: Skin is warm and dry.     Capillary Refill: Capillary refill takes less than 2 seconds.  Neurological:     General: No focal deficit present.     Mental Status: He  is alert and oriented to person, place, and time.      UC Treatments / Results  Labs (all labs ordered are listed, but only abnormal results are displayed) Labs Reviewed  CYTOLOGY, (ORAL, ANAL, URETHRAL) ANCILLARY ONLY    EKG   Radiology No results found.  Procedures Procedures (including critical care time)  Medications Ordered in UC Medications - No data to display  Initial Impression / Assessment and Plan / UC Course  I have reviewed the triage vital signs and the nursing notes.  Pertinent labs & imaging results that were available during my care of the patient were reviewed by me and considered in my medical decision making (see chart for details).     STI exposure Final Clinical Impressions(s) / UC Diagnoses   Final diagnoses:  Possible exposure to STI     Discharge Instructions      Your swab is pending.  You will receive a call from the nurse or the results of your test.  If there are any positive test you will be prescribed the appropriate treatment at that time.  If you develop symptoms or symptoms persist or any worse please follow-up for reevaluation.     ED Prescriptions   None    PDMP not reviewed this encounter.   Thad Ranger [place], [place] 06/07/22 [phone]

## 2022-06-11 LAB — CYTOLOGY, (ORAL, ANAL, URETHRAL) ANCILLARY ONLY
Chlamydia: NEGATIVE
Comment: NEGATIVE
Comment: NEGATIVE
Comment: NORMAL
Neisseria Gonorrhea: NEGATIVE
Trichomonas: POSITIVE — AB

## 2022-06-12 ENCOUNTER — Telehealth (HOSPITAL_COMMUNITY): Payer: Self-pay | Admitting: Emergency Medicine

## 2022-06-12 MED ORDER — METRONIDAZOLE 500 MG PO TABS: 2000.0000 mg | ORAL_TABLET | Freq: Once | ORAL | 0 refills | Status: AC
